# Patient Record
Sex: Male | Born: 1950 | Race: White | Hispanic: No
Health system: Southern US, Community
[De-identification: ages and names within clinical notes are randomized; demographics above are authoritative.]

---

## 2015-12-17 ENCOUNTER — Other Ambulatory Visit (HOSPITAL_COMMUNITY): Payer: Self-pay

## 2015-12-17 ENCOUNTER — Inpatient Hospital Stay
Admission: AD | Admit: 2015-12-17 | Discharge: 2016-02-11 | Disposition: A | Discharge status: Skilled Nursing Facility | Payer: Self-pay | Source: Ambulatory Visit | Attending: Internal Medicine | Admitting: Internal Medicine

## 2015-12-17 DIAGNOSIS — R1310 Dysphagia, unspecified: Secondary | ICD-10-CM

## 2015-12-17 DIAGNOSIS — Z931 Gastrostomy status: Secondary | ICD-10-CM

## 2015-12-17 DIAGNOSIS — I82409 Acute embolism and thrombosis of unspecified deep veins of unspecified lower extremity: Secondary | ICD-10-CM

## 2015-12-17 DIAGNOSIS — Z4659 Encounter for fitting and adjustment of other gastrointestinal appliance and device: Secondary | ICD-10-CM

## 2015-12-17 DIAGNOSIS — R140 Abdominal distension (gaseous): Secondary | ICD-10-CM

## 2015-12-17 DIAGNOSIS — I82403 Acute embolism and thrombosis of unspecified deep veins of lower extremity, bilateral: Secondary | ICD-10-CM

## 2015-12-17 DIAGNOSIS — Z9889 Other specified postprocedural states: Secondary | ICD-10-CM

## 2015-12-17 DIAGNOSIS — Z452 Encounter for adjustment and management of vascular access device: Secondary | ICD-10-CM

## 2015-12-17 DIAGNOSIS — A419 Sepsis, unspecified organism: Secondary | ICD-10-CM

## 2015-12-17 DIAGNOSIS — J189 Pneumonia, unspecified organism: Secondary | ICD-10-CM

## 2015-12-17 DIAGNOSIS — Z9911 Dependence on respirator [ventilator] status: Secondary | ICD-10-CM

## 2015-12-17 DIAGNOSIS — J9690 Respiratory failure, unspecified, unspecified whether with hypoxia or hypercapnia: Secondary | ICD-10-CM

## 2015-12-17 DIAGNOSIS — M869 Osteomyelitis, unspecified: Secondary | ICD-10-CM

## 2015-12-18 LAB — COMPREHENSIVE METABOLIC PANEL
ALBUMIN: 1.7 g/dL — AB (ref 3.5–5.0)
ALK PHOS: 101 U/L (ref 38–126)
ALT: 13 U/L — ABNORMAL LOW (ref 17–63)
ANION GAP: 8 (ref 5–15)
AST: 18 U/L (ref 15–41)
BUN: 15 mg/dL (ref 6–20)
CALCIUM: 7.6 mg/dL — AB (ref 8.9–10.3)
CO2: 19 mmol/L — AB (ref 22–32)
Chloride: 114 mmol/L — ABNORMAL HIGH (ref 101–111)
Creatinine, Ser: 1.67 mg/dL — ABNORMAL HIGH (ref 0.61–1.24)
GFR calc non Af Amer: 41 mL/min — ABNORMAL LOW (ref >60)
GFR, EST AFRICAN AMERICAN: 47 mL/min — AB (ref >60)
Glucose, Bld: 248 mg/dL — ABNORMAL HIGH (ref 65–99)
POTASSIUM: 3.3 mmol/L — AB (ref 3.5–5.1)
Sodium: 141 mmol/L (ref 135–145)
TOTAL PROTEIN: 4.8 g/dL — AB (ref 6.5–8.1)
Total Bilirubin: 0.4 mg/dL (ref 0.3–1.2)

## 2015-12-18 LAB — CBC WITH DIFFERENTIAL/PLATELET
BASOS ABS: 0 10*3/uL (ref 0.0–0.1)
Basophils Relative: 0 %
Eosinophils Absolute: 0.4 10*3/uL (ref 0.0–0.7)
Eosinophils Relative: 3 %
HEMATOCRIT: 32.8 % — AB (ref 39.0–52.0)
Hemoglobin: 10.5 g/dL — ABNORMAL LOW (ref 13.0–17.0)
LYMPHS PCT: 9 %
Lymphs Abs: 1.4 10*3/uL (ref 0.7–4.0)
MCH: 29.5 pg (ref 26.0–34.0)
MCHC: 32 g/dL (ref 30.0–36.0)
MCV: 92.1 fL (ref 78.0–100.0)
Monocytes Absolute: 1.2 10*3/uL — ABNORMAL HIGH (ref 0.1–1.0)
Monocytes Relative: 7 %
NEUTROS ABS: 12.7 10*3/uL — AB (ref 1.7–7.7)
Neutrophils Relative %: 81 %
PLATELETS: 286 10*3/uL (ref 150–400)
RBC: 3.56 MIL/uL — AB (ref 4.22–5.81)
RDW: 13.9 % (ref 11.5–15.5)
WBC: 15.7 10*3/uL — AB (ref 4.0–10.5)

## 2015-12-18 LAB — T4, FREE: Free T4: 1.03 ng/dL (ref 0.61–1.12)

## 2015-12-18 LAB — PROTIME-INR
INR: 1.23 (ref 0.00–1.49)
PROTHROMBIN TIME: 15.6 s — AB (ref 11.6–15.2)

## 2015-12-18 LAB — MAGNESIUM: Magnesium: 1.6 mg/dL — ABNORMAL LOW (ref 1.7–2.4)

## 2015-12-18 LAB — C-REACTIVE PROTEIN: CRP: 1.5 mg/dL — AB (ref <1.0)

## 2015-12-18 LAB — SEDIMENTATION RATE: SED RATE: 77 mm/h — AB (ref 0–16)

## 2015-12-18 LAB — PHOSPHORUS: PHOSPHORUS: 2.3 mg/dL — AB (ref 2.5–4.6)

## 2015-12-18 LAB — PROCALCITONIN: PROCALCITONIN: 0.21 ng/mL

## 2015-12-18 LAB — TSH: TSH: 1.527 u[IU]/mL (ref 0.350–4.500)

## 2015-12-18 NOTE — Consult Note (Signed)
    ORTHOPAEDIC CONSULTATION  REQUESTING PHYSICIAN: Carron CurieAli Hijazi, MD  Chief Complaint: Dehiscence right below the knee amputation  HPI: Lawrence PandaGerald E Brayman Jr. is a 65 y.o. male who presents with bilateral transtibial amputations. He states that the right below the knee dictation was about 6 months ago.  No past medical history on file. No past surgical history on file. Social History   Social History  . Marital Status: Divorced    Spouse Name: N/A  . Number of Children: N/A  . Years of Education: N/A   Social History Main Topics  . Smoking status: Not on file  . Smokeless tobacco: Not on file  . Alcohol Use: Not on file  . Drug Use: Not on file  . Sexual Activity: Not on file   Other Topics Concern  . Not on file   Social History Narrative  . No narrative on file   No family history on file. - negative except otherwise stated in the family history section Allergies not on file Prior to Admission medications   Not on File   Dg Chest Port 1 View  12/17/2015  CLINICAL DATA:  Shortness of breath for 1 day EXAM: PORTABLE CHEST 1 VIEW COMPARISON:  None. FINDINGS: Right jugular central venous catheter with the tip projecting over the SVC. There is no focal parenchymal opacity. There is no pleural effusion or pneumothorax. The heart and mediastinal contours are unremarkable. The osseous structures are unremarkable. IMPRESSION: No active disease. Electronically Signed   By: Elige KoHetal  Patel   On: 12/17/2015 18:50   - pertinent xrays, CT, MRI studies were reviewed and independently interpreted  Positive ROS: All other systems have been reviewed and were otherwise negative with the exception of those mentioned in the HPI and as above.  Physical Exam: General: Alert, no acute distress Cardiovascular: Edema bilateral lower extremities Respiratory: No cyanosis, no use of accessory musculature GI: No organomegaly, abdomen is soft and non-tender Skin: Necrotic transtibial amputation the  right with staples in place. Neurologic: Sensation intact distally Psychiatric: Patient is competent for consent with normal mood and affect Lymphatic: No axillary or cervical lymphadenopathy  MUSCULOSKELETAL:  On examination patient has a well-healed left transtibial amputation on the right there is necrotic tissue open wound with no purulent drainage no ascending cellulitis.  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  Plan: I discussed with the patient recommendation to proceed with an above-the-knee amputation on the right. Patient does not have enough soft tissue to preserve a transtibial amputation. Discussed that without surgery he is at risk of loss of life due to the necrotic infection of the right transtibial amputation. Patient states he is not ready to make a decision at this time and he would like to contact me when he is ready to proceed with surgery. Discussed that that would be okay but I feel that this needs to be done urgently due to the risk of patient's wife due to further infection from the wound dehiscence. Patient states he understands.  Thank you for the consult and the opportunity to see Mr. Carl BestWheeler  Goebel Hellums, MD [facility] [phone] 6:43 PM

## 2015-12-19 ENCOUNTER — Other Ambulatory Visit (HOSPITAL_COMMUNITY): Payer: Self-pay

## 2015-12-19 LAB — URINALYSIS, ROUTINE W REFLEX MICROSCOPIC
BILIRUBIN URINE: NEGATIVE
Glucose, UA: 500 mg/dL — AB
KETONES UR: NEGATIVE mg/dL
Leukocytes, UA: NEGATIVE
NITRITE: NEGATIVE
Specific Gravity, Urine: 1.021 (ref 1.005–1.030)
pH: 6 (ref 5.0–8.0)

## 2015-12-19 LAB — CBC WITH DIFFERENTIAL/PLATELET
BASOS ABS: 0 10*3/uL (ref 0.0–0.1)
BASOS PCT: 0 %
Eosinophils Absolute: 0.5 10*3/uL (ref 0.0–0.7)
Eosinophils Relative: 4 %
HEMATOCRIT: 33.1 % — AB (ref 39.0–52.0)
Hemoglobin: 11 g/dL — ABNORMAL LOW (ref 13.0–17.0)
LYMPHS PCT: 18 %
Lymphs Abs: 2.1 10*3/uL (ref 0.7–4.0)
MCH: 30.2 pg (ref 26.0–34.0)
MCHC: 33.2 g/dL (ref 30.0–36.0)
MCV: 90.9 fL (ref 78.0–100.0)
MONO ABS: 1.4 10*3/uL — AB (ref 0.1–1.0)
Monocytes Relative: 12 %
NEUTROS ABS: 7.8 10*3/uL — AB (ref 1.7–7.7)
Neutrophils Relative %: 66 %
PLATELETS: 336 10*3/uL (ref 150–400)
RBC: 3.64 MIL/uL — AB (ref 4.22–5.81)
RDW: 13.9 % (ref 11.5–15.5)
WBC: 11.9 10*3/uL — AB (ref 4.0–10.5)

## 2015-12-19 LAB — BASIC METABOLIC PANEL
ANION GAP: 7 (ref 5–15)
BUN: 12 mg/dL (ref 6–20)
CALCIUM: 7.8 mg/dL — AB (ref 8.9–10.3)
CO2: 22 mmol/L (ref 22–32)
Chloride: 113 mmol/L — ABNORMAL HIGH (ref 101–111)
Creatinine, Ser: 1.57 mg/dL — ABNORMAL HIGH (ref 0.61–1.24)
GFR, EST AFRICAN AMERICAN: 51 mL/min — AB (ref >60)
GFR, EST NON AFRICAN AMERICAN: 44 mL/min — AB (ref >60)
Glucose, Bld: 123 mg/dL — ABNORMAL HIGH (ref 65–99)
POTASSIUM: 2.9 mmol/L — AB (ref 3.5–5.1)
Sodium: 142 mmol/L (ref 135–145)

## 2015-12-19 LAB — URINE MICROSCOPIC-ADD ON

## 2015-12-19 LAB — HEMOGLOBIN A1C
HEMOGLOBIN A1C: 9.3 % — AB (ref 4.8–5.6)
MEAN PLASMA GLUCOSE: 220 mg/dL

## 2015-12-19 LAB — MAGNESIUM: Magnesium: 1.4 mg/dL — ABNORMAL LOW (ref 1.7–2.4)

## 2015-12-19 LAB — PHOSPHORUS: PHOSPHORUS: 2.8 mg/dL (ref 2.5–4.6)

## 2015-12-20 ENCOUNTER — Institutional Professional Consult (permissible substitution) (HOSPITAL_BASED_OUTPATIENT_CLINIC_OR_DEPARTMENT_OTHER): Payer: Self-pay

## 2015-12-20 ENCOUNTER — Other Ambulatory Visit (HOSPITAL_COMMUNITY): Payer: Self-pay

## 2015-12-20 DIAGNOSIS — I82409 Acute embolism and thrombosis of unspecified deep veins of unspecified lower extremity: Secondary | ICD-10-CM

## 2015-12-20 LAB — BASIC METABOLIC PANEL
ANION GAP: 5 (ref 5–15)
BUN: 12 mg/dL (ref 6–20)
CALCIUM: 7.7 mg/dL — AB (ref 8.9–10.3)
CHLORIDE: 110 mmol/L (ref 101–111)
CO2: 23 mmol/L (ref 22–32)
CREATININE: 1.75 mg/dL — AB (ref 0.61–1.24)
GFR calc Af Amer: 45 mL/min — ABNORMAL LOW (ref >60)
GFR calc non Af Amer: 39 mL/min — ABNORMAL LOW (ref >60)
GLUCOSE: 136 mg/dL — AB (ref 65–99)
Potassium: 2.8 mmol/L — ABNORMAL LOW (ref 3.5–5.1)
Sodium: 138 mmol/L (ref 135–145)

## 2015-12-20 LAB — CBC
HCT: 30.3 % — ABNORMAL LOW (ref 39.0–52.0)
HEMOGLOBIN: 10 g/dL — AB (ref 13.0–17.0)
MCH: 29.9 pg (ref 26.0–34.0)
MCHC: 33 g/dL (ref 30.0–36.0)
MCV: 90.7 fL (ref 78.0–100.0)
Platelets: 390 10*3/uL (ref 150–400)
RBC: 3.34 MIL/uL — AB (ref 4.22–5.81)
RDW: 13.8 % (ref 11.5–15.5)
WBC: 10.7 10*3/uL — ABNORMAL HIGH (ref 4.0–10.5)

## 2015-12-20 LAB — URINE CULTURE: CULTURE: NO GROWTH

## 2015-12-20 NOTE — Progress Notes (Signed)
*  PRELIMINARY RESULTS* Vascular Ultrasound Right upper extremity venous duplex has been completed.  Preliminary findings: No evidence of DVT or superficial thrombosis in visualized veins.   Farrel DemarkJill Eunice, RDMS, RVT  12/20/2015, 2:22 PM

## 2015-12-21 LAB — CBC WITH DIFFERENTIAL/PLATELET
Basophils Absolute: 0 K/uL (ref 0.0–0.1)
Basophils Relative: 0 %
Eosinophils Absolute: 0.6 K/uL (ref 0.0–0.7)
Eosinophils Relative: 4 %
HCT: 32.2 % — ABNORMAL LOW (ref 39.0–52.0)
Hemoglobin: 10.5 g/dL — ABNORMAL LOW (ref 13.0–17.0)
Lymphocytes Relative: 10 %
Lymphs Abs: 1.7 K/uL (ref 0.7–4.0)
MCH: 29.3 pg (ref 26.0–34.0)
MCHC: 32.6 g/dL (ref 30.0–36.0)
MCV: 89.9 fL (ref 78.0–100.0)
Monocytes Absolute: 1.7 K/uL — ABNORMAL HIGH (ref 0.1–1.0)
Monocytes Relative: 11 %
Neutro Abs: 12.1 K/uL — ABNORMAL HIGH (ref 1.7–7.7)
Neutrophils Relative %: 75 %
Platelets: 405 K/uL — ABNORMAL HIGH (ref 150–400)
RBC: 3.58 MIL/uL — ABNORMAL LOW (ref 4.22–5.81)
RDW: 13.8 % (ref 11.5–15.5)
WBC: 16.1 K/uL — ABNORMAL HIGH (ref 4.0–10.5)

## 2015-12-21 LAB — BASIC METABOLIC PANEL
Anion gap: 8 (ref 5–15)
BUN: 12 mg/dL (ref 6–20)
CALCIUM: 7.8 mg/dL — AB (ref 8.9–10.3)
CO2: 24 mmol/L (ref 22–32)
CREATININE: 1.59 mg/dL — AB (ref 0.61–1.24)
Chloride: 109 mmol/L (ref 101–111)
GFR calc non Af Amer: 43 mL/min — ABNORMAL LOW (ref >60)
GFR, EST AFRICAN AMERICAN: 50 mL/min — AB (ref >60)
Glucose, Bld: 116 mg/dL — ABNORMAL HIGH (ref 65–99)
Potassium: 3.1 mmol/L — ABNORMAL LOW (ref 3.5–5.1)
SODIUM: 141 mmol/L (ref 135–145)

## 2015-12-22 LAB — CBC WITH DIFFERENTIAL/PLATELET
BASOS ABS: 0 10*3/uL (ref 0.0–0.1)
BASOS PCT: 0 %
Eosinophils Absolute: 0.9 10*3/uL — ABNORMAL HIGH (ref 0.0–0.7)
Eosinophils Relative: 7 %
HEMATOCRIT: 28.8 % — AB (ref 39.0–52.0)
HEMOGLOBIN: 9.4 g/dL — AB (ref 13.0–17.0)
LYMPHS PCT: 19 %
Lymphs Abs: 2.4 10*3/uL (ref 0.7–4.0)
MCH: 29.8 pg (ref 26.0–34.0)
MCHC: 32.6 g/dL (ref 30.0–36.0)
MCV: 91.4 fL (ref 78.0–100.0)
MONOS PCT: 12 %
Monocytes Absolute: 1.5 10*3/uL — ABNORMAL HIGH (ref 0.1–1.0)
NEUTROS ABS: 8 10*3/uL — AB (ref 1.7–7.7)
NEUTROS PCT: 62 %
Platelets: 400 10*3/uL (ref 150–400)
RBC: 3.15 MIL/uL — ABNORMAL LOW (ref 4.22–5.81)
RDW: 14.2 % (ref 11.5–15.5)
WBC: 12.8 10*3/uL — ABNORMAL HIGH (ref 4.0–10.5)

## 2015-12-22 LAB — BASIC METABOLIC PANEL
ANION GAP: 5 (ref 5–15)
BUN: 15 mg/dL (ref 6–20)
CHLORIDE: 111 mmol/L (ref 101–111)
CO2: 24 mmol/L (ref 22–32)
Calcium: 6.9 mg/dL — ABNORMAL LOW (ref 8.9–10.3)
Creatinine, Ser: 1.51 mg/dL — ABNORMAL HIGH (ref 0.61–1.24)
GFR calc non Af Amer: 46 mL/min — ABNORMAL LOW (ref >60)
GFR, EST AFRICAN AMERICAN: 53 mL/min — AB (ref >60)
Glucose, Bld: 81 mg/dL (ref 65–99)
Potassium: 3.4 mmol/L — ABNORMAL LOW (ref 3.5–5.1)
Sodium: 140 mmol/L (ref 135–145)

## 2015-12-23 ENCOUNTER — Other Ambulatory Visit (HOSPITAL_COMMUNITY): Payer: Self-pay

## 2015-12-23 LAB — CBC WITH DIFFERENTIAL/PLATELET
Basophils Absolute: 0 10*3/uL (ref 0.0–0.1)
Basophils Relative: 0 %
EOS PCT: 5 %
Eosinophils Absolute: 0.7 10*3/uL (ref 0.0–0.7)
HEMATOCRIT: 28.1 % — AB (ref 39.0–52.0)
Hemoglobin: 9.2 g/dL — ABNORMAL LOW (ref 13.0–17.0)
LYMPHS ABS: 2.1 10*3/uL (ref 0.7–4.0)
LYMPHS PCT: 15 %
MCH: 29.9 pg (ref 26.0–34.0)
MCHC: 32.7 g/dL (ref 30.0–36.0)
MCV: 91.2 fL (ref 78.0–100.0)
MONO ABS: 1.5 10*3/uL — AB (ref 0.1–1.0)
Monocytes Relative: 10 %
NEUTROS ABS: 10.3 10*3/uL — AB (ref 1.7–7.7)
Neutrophils Relative %: 70 %
PLATELETS: 392 10*3/uL (ref 150–400)
RBC: 3.08 MIL/uL — ABNORMAL LOW (ref 4.22–5.81)
RDW: 14.3 % (ref 11.5–15.5)
WBC: 14.6 10*3/uL — ABNORMAL HIGH (ref 4.0–10.5)

## 2015-12-23 LAB — BASIC METABOLIC PANEL
Anion gap: 5 (ref 5–15)
BUN: 15 mg/dL (ref 6–20)
CALCIUM: 7.3 mg/dL — AB (ref 8.9–10.3)
CO2: 23 mmol/L (ref 22–32)
Chloride: 109 mmol/L (ref 101–111)
Creatinine, Ser: 1.59 mg/dL — ABNORMAL HIGH (ref 0.61–1.24)
GFR calc Af Amer: 50 mL/min — ABNORMAL LOW (ref >60)
GFR, EST NON AFRICAN AMERICAN: 43 mL/min — AB (ref >60)
GLUCOSE: 208 mg/dL — AB (ref 65–99)
POTASSIUM: 4 mmol/L (ref 3.5–5.1)
Sodium: 137 mmol/L (ref 135–145)

## 2015-12-23 LAB — MAGNESIUM: Magnesium: 1.5 mg/dL — ABNORMAL LOW (ref 1.7–2.4)

## 2015-12-23 LAB — PHOSPHORUS: Phosphorus: 3.7 mg/dL (ref 2.5–4.6)

## 2015-12-24 LAB — BASIC METABOLIC PANEL
ANION GAP: 7 (ref 5–15)
BUN: 17 mg/dL (ref 6–20)
CALCIUM: 7.5 mg/dL — AB (ref 8.9–10.3)
CO2: 23 mmol/L (ref 22–32)
Chloride: 108 mmol/L (ref 101–111)
Creatinine, Ser: 1.49 mg/dL — ABNORMAL HIGH (ref 0.61–1.24)
GFR, EST AFRICAN AMERICAN: 54 mL/min — AB (ref >60)
GFR, EST NON AFRICAN AMERICAN: 47 mL/min — AB (ref >60)
Glucose, Bld: 90 mg/dL (ref 65–99)
Potassium: 3.6 mmol/L (ref 3.5–5.1)
Sodium: 138 mmol/L (ref 135–145)

## 2015-12-24 LAB — PHOSPHORUS: PHOSPHORUS: 3.4 mg/dL (ref 2.5–4.6)

## 2015-12-24 LAB — MAGNESIUM: Magnesium: 1.5 mg/dL — ABNORMAL LOW (ref 1.7–2.4)

## 2015-12-26 LAB — RENAL FUNCTION PANEL
ALBUMIN: 1.6 g/dL — AB (ref 3.5–5.0)
ANION GAP: 7 (ref 5–15)
BUN: 15 mg/dL (ref 6–20)
CHLORIDE: 108 mmol/L (ref 101–111)
CO2: 24 mmol/L (ref 22–32)
Calcium: 8.1 mg/dL — ABNORMAL LOW (ref 8.9–10.3)
Creatinine, Ser: 1.54 mg/dL — ABNORMAL HIGH (ref 0.61–1.24)
GFR, EST AFRICAN AMERICAN: 52 mL/min — AB (ref >60)
GFR, EST NON AFRICAN AMERICAN: 45 mL/min — AB (ref >60)
Glucose, Bld: 126 mg/dL — ABNORMAL HIGH (ref 65–99)
PHOSPHORUS: 3 mg/dL (ref 2.5–4.6)
POTASSIUM: 3.7 mmol/L (ref 3.5–5.1)
Sodium: 139 mmol/L (ref 135–145)

## 2015-12-26 LAB — CBC WITH DIFFERENTIAL/PLATELET
BASOS ABS: 0 10*3/uL (ref 0.0–0.1)
BASOS PCT: 0 %
Eosinophils Absolute: 0.4 10*3/uL (ref 0.0–0.7)
Eosinophils Relative: 2 %
HEMATOCRIT: 29.3 % — AB (ref 39.0–52.0)
HEMOGLOBIN: 9.4 g/dL — AB (ref 13.0–17.0)
LYMPHS PCT: 10 %
Lymphs Abs: 1.8 10*3/uL (ref 0.7–4.0)
MCH: 29.6 pg (ref 26.0–34.0)
MCHC: 32.1 g/dL (ref 30.0–36.0)
MCV: 92.1 fL (ref 78.0–100.0)
MONO ABS: 1 10*3/uL (ref 0.1–1.0)
MONOS PCT: 6 %
NEUTROS ABS: 13.9 10*3/uL — AB (ref 1.7–7.7)
NEUTROS PCT: 82 %
Platelets: 350 10*3/uL (ref 150–400)
RBC: 3.18 MIL/uL — ABNORMAL LOW (ref 4.22–5.81)
RDW: 14.4 % (ref 11.5–15.5)
WBC: 17 10*3/uL — ABNORMAL HIGH (ref 4.0–10.5)

## 2015-12-26 LAB — MAGNESIUM: MAGNESIUM: 1.6 mg/dL — AB (ref 1.7–2.4)

## 2015-12-28 LAB — RENAL FUNCTION PANEL
ALBUMIN: 1.5 g/dL — AB (ref 3.5–5.0)
Anion gap: 9 (ref 5–15)
BUN: 17 mg/dL (ref 6–20)
CHLORIDE: 107 mmol/L (ref 101–111)
CO2: 22 mmol/L (ref 22–32)
CREATININE: 1.49 mg/dL — AB (ref 0.61–1.24)
Calcium: 8 mg/dL — ABNORMAL LOW (ref 8.9–10.3)
GFR, EST AFRICAN AMERICAN: 54 mL/min — AB (ref >60)
GFR, EST NON AFRICAN AMERICAN: 47 mL/min — AB (ref >60)
Glucose, Bld: 77 mg/dL (ref 65–99)
PHOSPHORUS: 3 mg/dL (ref 2.5–4.6)
POTASSIUM: 3.6 mmol/L (ref 3.5–5.1)
Sodium: 138 mmol/L (ref 135–145)

## 2015-12-28 LAB — CBC WITH DIFFERENTIAL/PLATELET
BASOS ABS: 0 10*3/uL (ref 0.0–0.1)
Basophils Relative: 0 %
Eosinophils Absolute: 0.4 10*3/uL (ref 0.0–0.7)
Eosinophils Relative: 3 %
HEMATOCRIT: 27.4 % — AB (ref 39.0–52.0)
Hemoglobin: 8.9 g/dL — ABNORMAL LOW (ref 13.0–17.0)
LYMPHS PCT: 21 %
Lymphs Abs: 2.5 10*3/uL (ref 0.7–4.0)
MCH: 29.7 pg (ref 26.0–34.0)
MCHC: 32.5 g/dL (ref 30.0–36.0)
MCV: 91.3 fL (ref 78.0–100.0)
Monocytes Absolute: 1.2 10*3/uL — ABNORMAL HIGH (ref 0.1–1.0)
Monocytes Relative: 10 %
NEUTROS ABS: 8 10*3/uL — AB (ref 1.7–7.7)
NEUTROS PCT: 66 %
PLATELETS: 301 10*3/uL (ref 150–400)
RBC: 3 MIL/uL — AB (ref 4.22–5.81)
RDW: 14.4 % (ref 11.5–15.5)
WBC: 12.1 10*3/uL — AB (ref 4.0–10.5)

## 2015-12-28 LAB — MAGNESIUM: MAGNESIUM: 1.8 mg/dL (ref 1.7–2.4)

## 2015-12-29 ENCOUNTER — Other Ambulatory Visit (HOSPITAL_COMMUNITY): Payer: Self-pay | Admitting: Family

## 2015-12-29 LAB — CBC
HEMATOCRIT: 25.7 % — AB (ref 39.0–52.0)
HEMOGLOBIN: 8.3 g/dL — AB (ref 13.0–17.0)
MCH: 29.5 pg (ref 26.0–34.0)
MCHC: 32.3 g/dL (ref 30.0–36.0)
MCV: 91.5 fL (ref 78.0–100.0)
Platelets: 267 10*3/uL (ref 150–400)
RBC: 2.81 MIL/uL — ABNORMAL LOW (ref 4.22–5.81)
RDW: 14.4 % (ref 11.5–15.5)
WBC: 8.3 10*3/uL (ref 4.0–10.5)

## 2015-12-29 LAB — RENAL FUNCTION PANEL
ALBUMIN: 1.5 g/dL — AB (ref 3.5–5.0)
ANION GAP: 7 (ref 5–15)
BUN: 16 mg/dL (ref 6–20)
CALCIUM: 7.7 mg/dL — AB (ref 8.9–10.3)
CO2: 25 mmol/L (ref 22–32)
Chloride: 105 mmol/L (ref 101–111)
Creatinine, Ser: 1.5 mg/dL — ABNORMAL HIGH (ref 0.61–1.24)
GFR calc non Af Amer: 46 mL/min — ABNORMAL LOW (ref >60)
GFR, EST AFRICAN AMERICAN: 54 mL/min — AB (ref >60)
Glucose, Bld: 134 mg/dL — ABNORMAL HIGH (ref 65–99)
PHOSPHORUS: 3.4 mg/dL (ref 2.5–4.6)
Potassium: 3.8 mmol/L (ref 3.5–5.1)
SODIUM: 137 mmol/L (ref 135–145)

## 2015-12-29 LAB — MAGNESIUM: Magnesium: 1.9 mg/dL (ref 1.7–2.4)

## 2015-12-31 ENCOUNTER — Encounter
Admission: AD | Disposition: A | Discharge status: Skilled Nursing Facility | Payer: Self-pay | Source: Ambulatory Visit | Attending: Internal Medicine

## 2015-12-31 SURGERY — AMPUTATION, ABOVE KNEE
Anesthesia: General | Laterality: Right

## 2016-01-01 LAB — CBC WITH DIFFERENTIAL/PLATELET
BASOS ABS: 0 10*3/uL (ref 0.0–0.1)
BASOS PCT: 1 %
EOS PCT: 6 %
Eosinophils Absolute: 0.3 10*3/uL (ref 0.0–0.7)
HCT: 24.5 % — ABNORMAL LOW (ref 39.0–52.0)
Hemoglobin: 8 g/dL — ABNORMAL LOW (ref 13.0–17.0)
Lymphocytes Relative: 29 %
Lymphs Abs: 1.7 10*3/uL (ref 0.7–4.0)
MCH: 29.6 pg (ref 26.0–34.0)
MCHC: 32.7 g/dL (ref 30.0–36.0)
MCV: 90.7 fL (ref 78.0–100.0)
MONO ABS: 1.1 10*3/uL — AB (ref 0.1–1.0)
Monocytes Relative: 19 %
Neutro Abs: 2.7 10*3/uL (ref 1.7–7.7)
Neutrophils Relative %: 45 %
PLATELETS: 220 10*3/uL (ref 150–400)
RBC: 2.7 MIL/uL — ABNORMAL LOW (ref 4.22–5.81)
RDW: 14.2 % (ref 11.5–15.5)
WBC: 5.9 10*3/uL (ref 4.0–10.5)

## 2016-01-01 LAB — RENAL FUNCTION PANEL
ALBUMIN: 1.2 g/dL — AB (ref 3.5–5.0)
ANION GAP: 5 (ref 5–15)
BUN: 10 mg/dL (ref 6–20)
CALCIUM: 6.6 mg/dL — AB (ref 8.9–10.3)
CO2: 22 mmol/L (ref 22–32)
Chloride: 112 mmol/L — ABNORMAL HIGH (ref 101–111)
Creatinine, Ser: 1.19 mg/dL (ref 0.61–1.24)
GFR calc Af Amer: >60 mL/min (ref >60)
GLUCOSE: 94 mg/dL (ref 65–99)
PHOSPHORUS: 2.7 mg/dL (ref 2.5–4.6)
Potassium: 3.4 mmol/L — ABNORMAL LOW (ref 3.5–5.1)
SODIUM: 139 mmol/L (ref 135–145)

## 2016-01-01 LAB — MAGNESIUM: Magnesium: 1.4 mg/dL — ABNORMAL LOW (ref 1.7–2.4)

## 2016-01-02 LAB — RENAL FUNCTION PANEL
ALBUMIN: 1.6 g/dL — AB (ref 3.5–5.0)
ANION GAP: 10 (ref 5–15)
BUN: 13 mg/dL (ref 6–20)
CALCIUM: 7.9 mg/dL — AB (ref 8.9–10.3)
CO2: 23 mmol/L (ref 22–32)
CREATININE: 1.55 mg/dL — AB (ref 0.61–1.24)
Chloride: 103 mmol/L (ref 101–111)
GFR calc non Af Amer: 45 mL/min — ABNORMAL LOW (ref >60)
GFR, EST AFRICAN AMERICAN: 52 mL/min — AB (ref >60)
GLUCOSE: 165 mg/dL — AB (ref 65–99)
PHOSPHORUS: 3.2 mg/dL (ref 2.5–4.6)
Potassium: 4.2 mmol/L (ref 3.5–5.1)
SODIUM: 136 mmol/L (ref 135–145)

## 2016-01-02 LAB — MAGNESIUM: Magnesium: 1.8 mg/dL (ref 1.7–2.4)

## 2016-01-03 ENCOUNTER — Encounter (HOSPITAL_BASED_OUTPATIENT_CLINIC_OR_DEPARTMENT_OTHER): Payer: Self-pay

## 2016-01-03 DIAGNOSIS — I82403 Acute embolism and thrombosis of unspecified deep veins of lower extremity, bilateral: Secondary | ICD-10-CM

## 2016-01-03 LAB — SEDIMENTATION RATE: Sed Rate: 90 mm/hr — ABNORMAL HIGH (ref 0–16)

## 2016-01-03 LAB — CBC
HCT: 26 % — ABNORMAL LOW (ref 39.0–52.0)
Hemoglobin: 8.3 g/dL — ABNORMAL LOW (ref 13.0–17.0)
MCH: 29.1 pg (ref 26.0–34.0)
MCHC: 31.9 g/dL (ref 30.0–36.0)
MCV: 91.2 fL (ref 78.0–100.0)
PLATELETS: 187 10*3/uL (ref 150–400)
RBC: 2.85 MIL/uL — ABNORMAL LOW (ref 4.22–5.81)
RDW: 14.1 % (ref 11.5–15.5)
WBC: 6.6 10*3/uL (ref 4.0–10.5)

## 2016-01-03 LAB — RENAL FUNCTION PANEL
ANION GAP: 8 (ref 5–15)
Albumin: 1.7 g/dL — ABNORMAL LOW (ref 3.5–5.0)
BUN: 15 mg/dL (ref 6–20)
CO2: 28 mmol/L (ref 22–32)
Calcium: 8.2 mg/dL — ABNORMAL LOW (ref 8.9–10.3)
Chloride: 102 mmol/L (ref 101–111)
Creatinine, Ser: 1.52 mg/dL — ABNORMAL HIGH (ref 0.61–1.24)
GFR calc non Af Amer: 46 mL/min — ABNORMAL LOW (ref >60)
GFR, EST AFRICAN AMERICAN: 53 mL/min — AB (ref >60)
GLUCOSE: 96 mg/dL (ref 65–99)
POTASSIUM: 3.6 mmol/L (ref 3.5–5.1)
Phosphorus: 2.8 mg/dL (ref 2.5–4.6)
Sodium: 138 mmol/L (ref 135–145)

## 2016-01-03 LAB — C DIFFICILE QUICK SCREEN W PCR REFLEX
C DIFFICILE (CDIFF) INTERP: NEGATIVE
C DIFFICILE (CDIFF) TOXIN: NEGATIVE
C Diff antigen: NEGATIVE

## 2016-01-03 LAB — C-REACTIVE PROTEIN: CRP: 1 mg/dL — ABNORMAL HIGH (ref <1.0)

## 2016-01-03 LAB — MAGNESIUM: MAGNESIUM: 1.8 mg/dL (ref 1.7–2.4)

## 2016-01-03 NOTE — Progress Notes (Signed)
*  Preliminary Results* Right upper extremity venous duplex completed. Study was technically difficult due to poor patient cooperation. Right upper extremity is negative for deep and superficial vein thrombosis. No obvious change when compared to prior study 12/20/15.  01/03/2016 10:44 AM  Gertie FeyMichelle Sally Reimers, RVT, RDCS, RDMS

## 2016-01-04 LAB — CBC
HCT: 23.1 % — ABNORMAL LOW (ref 39.0–52.0)
HCT: 23.3 % — ABNORMAL LOW (ref 39.0–52.0)
HEMATOCRIT: 20.5 % — AB (ref 39.0–52.0)
HEMOGLOBIN: 6.6 g/dL — AB (ref 13.0–17.0)
HEMOGLOBIN: 7.5 g/dL — AB (ref 13.0–17.0)
Hemoglobin: 7.6 g/dL — ABNORMAL LOW (ref 13.0–17.0)
MCH: 29.6 pg (ref 26.0–34.0)
MCH: 29.8 pg (ref 26.0–34.0)
MCH: 29.6 pg (ref 26.0–34.0)
MCHC: 32.2 g/dL (ref 30.0–36.0)
MCHC: 32.9 g/dL (ref 30.0–36.0)
MCHC: 32.2 g/dL (ref 30.0–36.0)
MCV: 91.9 fL (ref 78.0–100.0)
MCV: 90.6 fL (ref 78.0–100.0)
MCV: 92.1 fL (ref 78.0–100.0)
PLATELETS: 177 10*3/uL (ref 150–400)
Platelets: 187 10*3/uL (ref 150–400)
Platelets: 177 K/uL (ref 150–400)
RBC: 2.23 MIL/uL — AB (ref 4.22–5.81)
RBC: 2.55 MIL/uL — ABNORMAL LOW (ref 4.22–5.81)
RBC: 2.53 MIL/uL — AB (ref 4.22–5.81)
RDW: 14 % (ref 11.5–15.5)
RDW: 13.9 % (ref 11.5–15.5)
RDW: 14.1 % (ref 11.5–15.5)
WBC: 7.2 10*3/uL (ref 4.0–10.5)
WBC: 6 K/uL (ref 4.0–10.5)
WBC: 7 10*3/uL (ref 4.0–10.5)

## 2016-01-04 LAB — RENAL FUNCTION PANEL
ANION GAP: 9 (ref 5–15)
Albumin: 1.8 g/dL — ABNORMAL LOW (ref 3.5–5.0)
BUN: 15 mg/dL (ref 6–20)
CHLORIDE: 101 mmol/L (ref 101–111)
CO2: 26 mmol/L (ref 22–32)
Calcium: 8.1 mg/dL — ABNORMAL LOW (ref 8.9–10.3)
Creatinine, Ser: 1.62 mg/dL — ABNORMAL HIGH (ref 0.61–1.24)
GFR, EST AFRICAN AMERICAN: 49 mL/min — AB (ref >60)
GFR, EST NON AFRICAN AMERICAN: 42 mL/min — AB (ref >60)
Glucose, Bld: 139 mg/dL — ABNORMAL HIGH (ref 65–99)
POTASSIUM: 3.9 mmol/L (ref 3.5–5.1)
Phosphorus: 2.8 mg/dL (ref 2.5–4.6)
Sodium: 136 mmol/L (ref 135–145)

## 2016-01-05 ENCOUNTER — Other Ambulatory Visit (HOSPITAL_COMMUNITY): Payer: Self-pay

## 2016-01-05 LAB — C DIFFICILE QUICK SCREEN W PCR REFLEX
C Diff antigen: NEGATIVE
C Diff interpretation: NEGATIVE
C Diff toxin: NEGATIVE

## 2016-01-05 LAB — CBC
HEMATOCRIT: 21.3 % — AB (ref 39.0–52.0)
HEMOGLOBIN: 7.1 g/dL — AB (ref 13.0–17.0)
MCH: 30 pg (ref 26.0–34.0)
MCHC: 33.3 g/dL (ref 30.0–36.0)
MCV: 89.9 fL (ref 78.0–100.0)
Platelets: 150 10*3/uL (ref 150–400)
RBC: 2.37 MIL/uL — AB (ref 4.22–5.81)
RDW: 14.1 % (ref 11.5–15.5)
WBC: 6.3 10*3/uL (ref 4.0–10.5)

## 2016-01-05 LAB — IRON AND TIBC: IRON: 78 ug/dL (ref 45–182)

## 2016-01-05 LAB — FERRITIN: Ferritin: 493 ng/mL — ABNORMAL HIGH (ref 24–336)

## 2016-01-05 LAB — RENAL FUNCTION PANEL
ANION GAP: 10 (ref 5–15)
Albumin: 1.6 g/dL — ABNORMAL LOW (ref 3.5–5.0)
Anion gap: 9 (ref 5–15)
BUN: 15 mg/dL (ref 6–20)
CALCIUM: 7.8 mg/dL — AB (ref 8.9–10.3)
CO2: 25 mmol/L (ref 22–32)
CREATININE: 1.62 mg/dL — AB (ref 0.61–1.24)
Chloride: 103 mmol/L (ref 101–111)
GFR, EST AFRICAN AMERICAN: 49 mL/min — AB (ref >60)
GFR, EST NON AFRICAN AMERICAN: 42 mL/min — AB (ref >60)
Glucose, Bld: 160 mg/dL — ABNORMAL HIGH (ref 65–99)
Phosphorus: 3.5 mg/dL (ref 2.5–4.6)
Potassium: 4.2 mmol/L (ref 3.5–5.1)
SODIUM: 137 mmol/L (ref 135–145)

## 2016-01-05 LAB — VITAMIN B12: Vitamin B-12: 1182 pg/mL — ABNORMAL HIGH (ref 180–914)

## 2016-01-05 LAB — MAGNESIUM
MAGNESIUM: 1.7 mg/dL (ref 1.7–2.4)
Magnesium: 1.7 mg/dL (ref 1.7–2.4)

## 2016-01-05 LAB — FOLATE: FOLATE: 9.8 ng/mL (ref >5.9)

## 2016-01-05 LAB — PHOSPHORUS

## 2016-01-06 LAB — RENAL FUNCTION PANEL
ANION GAP: 11 (ref 5–15)
Albumin: 1.7 g/dL — ABNORMAL LOW (ref 3.5–5.0)
BUN: 15 mg/dL (ref 6–20)
CHLORIDE: 104 mmol/L (ref 101–111)
CO2: 23 mmol/L (ref 22–32)
Calcium: 8 mg/dL — ABNORMAL LOW (ref 8.9–10.3)
Creatinine, Ser: 1.78 mg/dL — ABNORMAL HIGH (ref 0.61–1.24)
GFR calc non Af Amer: 38 mL/min — ABNORMAL LOW (ref >60)
GFR, EST AFRICAN AMERICAN: 44 mL/min — AB (ref >60)
GLUCOSE: 122 mg/dL — AB (ref 65–99)
Phosphorus: 3.5 mg/dL (ref 2.5–4.6)
Potassium: 3.8 mmol/L (ref 3.5–5.1)
Sodium: 138 mmol/L (ref 135–145)

## 2016-01-06 LAB — SEDIMENTATION RATE: SED RATE: 95 mm/h — AB (ref 0–16)

## 2016-01-06 LAB — CBC WITH DIFFERENTIAL/PLATELET
Basophils Absolute: 0 10*3/uL (ref 0.0–0.1)
Basophils Relative: 0 %
EOS PCT: 8 %
Eosinophils Absolute: 0.5 10*3/uL (ref 0.0–0.7)
HCT: 21.6 % — ABNORMAL LOW (ref 39.0–52.0)
HEMOGLOBIN: 7 g/dL — AB (ref 13.0–17.0)
LYMPHS ABS: 1.6 10*3/uL (ref 0.7–4.0)
LYMPHS PCT: 27 %
MCH: 29 pg (ref 26.0–34.0)
MCHC: 32.4 g/dL (ref 30.0–36.0)
MCV: 89.6 fL (ref 78.0–100.0)
MONOS PCT: 12 %
Monocytes Absolute: 0.8 10*3/uL (ref 0.1–1.0)
NEUTROS PCT: 53 %
Neutro Abs: 3.3 10*3/uL (ref 1.7–7.7)
Platelets: 159 10*3/uL (ref 150–400)
RBC: 2.41 MIL/uL — AB (ref 4.22–5.81)
RDW: 14.1 % (ref 11.5–15.5)
WBC: 6.2 10*3/uL (ref 4.0–10.5)

## 2016-01-06 LAB — HEMOGLOBIN A1C
Hgb A1c MFr Bld: 8.2 % — ABNORMAL HIGH (ref 4.8–5.6)
Mean Plasma Glucose: 189 mg/dL

## 2016-01-06 LAB — C-REACTIVE PROTEIN: CRP: 2 mg/dL — ABNORMAL HIGH (ref <1.0)

## 2016-01-07 LAB — RENAL FUNCTION PANEL
ALBUMIN: 1.6 g/dL — AB (ref 3.5–5.0)
Anion gap: 9 (ref 5–15)
BUN: 18 mg/dL (ref 6–20)
CO2: 22 mmol/L (ref 22–32)
CREATININE: 1.88 mg/dL — AB (ref 0.61–1.24)
Calcium: 7.8 mg/dL — ABNORMAL LOW (ref 8.9–10.3)
Chloride: 109 mmol/L (ref 101–111)
GFR, EST AFRICAN AMERICAN: 41 mL/min — AB (ref >60)
GFR, EST NON AFRICAN AMERICAN: 35 mL/min — AB (ref >60)
Glucose, Bld: 108 mg/dL — ABNORMAL HIGH (ref 65–99)
PHOSPHORUS: 3.8 mg/dL (ref 2.5–4.6)
Potassium: 3.6 mmol/L (ref 3.5–5.1)
Sodium: 140 mmol/L (ref 135–145)

## 2016-01-07 LAB — CBC WITH DIFFERENTIAL/PLATELET
BASOS ABS: 0 10*3/uL (ref 0.0–0.1)
BASOS PCT: 0 %
EOS PCT: 7 %
Eosinophils Absolute: 0.5 10*3/uL (ref 0.0–0.7)
HEMATOCRIT: 19.1 % — AB (ref 39.0–52.0)
Hemoglobin: 6.2 g/dL — CL (ref 13.0–17.0)
LYMPHS PCT: 26 %
Lymphs Abs: 1.6 10*3/uL (ref 0.7–4.0)
MCH: 28.8 pg (ref 26.0–34.0)
MCHC: 32.5 g/dL (ref 30.0–36.0)
MCV: 88.8 fL (ref 78.0–100.0)
MONO ABS: 1 10*3/uL (ref 0.1–1.0)
Monocytes Relative: 15 %
NEUTROS ABS: 3.3 10*3/uL (ref 1.7–7.7)
Neutrophils Relative %: 52 %
PLATELETS: 177 10*3/uL (ref 150–400)
RBC: 2.15 MIL/uL — AB (ref 4.22–5.81)
RDW: 14.5 % (ref 11.5–15.5)
WBC: 6.3 10*3/uL (ref 4.0–10.5)

## 2016-01-07 LAB — ABO/RH: ABO/RH(D): AB POS

## 2016-01-07 LAB — PHOSPHORUS: PHOSPHORUS: 3.8 mg/dL (ref 2.5–4.6)

## 2016-01-07 LAB — PREPARE RBC (CROSSMATCH)

## 2016-01-07 LAB — MAGNESIUM: MAGNESIUM: 1.7 mg/dL (ref 1.7–2.4)

## 2016-01-08 ENCOUNTER — Other Ambulatory Visit (HOSPITAL_COMMUNITY): Payer: Self-pay | Admitting: Family

## 2016-01-08 LAB — CBC WITH DIFFERENTIAL/PLATELET
Basophils Absolute: 0 10*3/uL (ref 0.0–0.1)
Basophils Relative: 0 %
EOS ABS: 0.6 10*3/uL (ref 0.0–0.7)
Eosinophils Relative: 8 %
HCT: 25.6 % — ABNORMAL LOW (ref 39.0–52.0)
HEMOGLOBIN: 8.4 g/dL — AB (ref 13.0–17.0)
LYMPHS ABS: 1.9 10*3/uL (ref 0.7–4.0)
Lymphocytes Relative: 24 %
MCH: 29 pg (ref 26.0–34.0)
MCHC: 32.8 g/dL (ref 30.0–36.0)
MCV: 88.3 fL (ref 78.0–100.0)
MONO ABS: 1.3 10*3/uL — AB (ref 0.1–1.0)
MONOS PCT: 17 %
NEUTROS PCT: 51 %
Neutro Abs: 3.9 10*3/uL (ref 1.7–7.7)
Platelets: 164 10*3/uL (ref 150–400)
RBC: 2.9 MIL/uL — ABNORMAL LOW (ref 4.22–5.81)
RDW: 15.5 % (ref 11.5–15.5)
WBC: 7.7 10*3/uL (ref 4.0–10.5)

## 2016-01-08 LAB — BASIC METABOLIC PANEL
Anion gap: 12 (ref 5–15)
BUN: 21 mg/dL — AB (ref 6–20)
CALCIUM: 8 mg/dL — AB (ref 8.9–10.3)
CO2: 23 mmol/L (ref 22–32)
CREATININE: 1.95 mg/dL — AB (ref 0.61–1.24)
Chloride: 105 mmol/L (ref 101–111)
GFR calc Af Amer: 39 mL/min — ABNORMAL LOW (ref >60)
GFR, EST NON AFRICAN AMERICAN: 34 mL/min — AB (ref >60)
GLUCOSE: 186 mg/dL — AB (ref 65–99)
POTASSIUM: 3.9 mmol/L (ref 3.5–5.1)
Sodium: 140 mmol/L (ref 135–145)

## 2016-01-08 LAB — MAGNESIUM: Magnesium: 1.9 mg/dL (ref 1.7–2.4)

## 2016-01-08 LAB — PHOSPHORUS: Phosphorus: 3.9 mg/dL (ref 2.5–4.6)

## 2016-01-08 MED ORDER — CEFAZOLIN SODIUM-DEXTROSE 2-4 GM/100ML-% IV SOLN
2.0000 g | INTRAVENOUS | Status: AC
  Administered 2016-01-09: 2 g via INTRAVENOUS

## 2016-01-08 NOTE — Anesthesia Preprocedure Evaluation (Addendum)
Anesthesia Evaluation  Patient identified by MRN, date of birth, ID band Patient awake and Patient confused    Reviewed: Allergy & Precautions, H&P , NPO status , Patient's Chart, lab work & pertinent test results, reviewed documented beta blocker date and time   Airway Mallampati: II  TM Distance: >3 FB Neck ROM: full    Dental no notable dental hx. (+) Poor Dentition   Pulmonary neg pulmonary ROS,    Pulmonary exam normal        Cardiovascular Exercise Tolerance: Good hypertension, Pt. on medications + Past MI  Normal cardiovascular exam+ dysrhythmias Atrial Fibrillation      Neuro/Psych Depression negative neurological ROS  negative psych ROS   GI/Hepatic negative GI ROS, Neg liver ROS, Status post GI bleed     Endo/Other  negative endocrine ROSdiabetes, Type obesity  Renal/GU Renal InsufficiencyRenal diseasenegative Renal ROS  negative genitourinary   Musculoskeletal   Abdominal Normal abdominal exam  (+)   Peds  Hematology negative hematology ROS (+) Blood dyscrasia, anemia ,   Anesthesia Other Findings   Reproductive/Obstetrics negative OB ROS                            Anesthesia Physical Anesthesia Plan  ASA: III  Anesthesia Plan: General   Post-op Pain Management:    Induction: Intravenous  Airway Management Planned: LMA  Additional Equipment:   Intra-op Plan:   Post-operative Plan: Extubation in OR  Informed Consent: I have reviewed the patients History and Physical, chart, labs and discussed the procedure including the risks, benefits and alternatives for the proposed anesthesia with the patient or authorized representative who has indicated his/her understanding and acceptance.   Dental advisory given and History available from chart only  Plan Discussed with: CRNA, Surgeon and Anesthesiologist  Anesthesia Plan Comments:        Anesthesia Quick  Evaluation

## 2016-01-09 ENCOUNTER — Encounter (HOSPITAL_COMMUNITY): Payer: Self-pay | Admitting: Anesthesiology

## 2016-01-09 ENCOUNTER — Other Ambulatory Visit (HOSPITAL_COMMUNITY): Payer: Self-pay

## 2016-01-09 ENCOUNTER — Encounter
Admission: AD | Disposition: A | Discharge status: Skilled Nursing Facility | Payer: Self-pay | Source: Ambulatory Visit | Attending: Internal Medicine

## 2016-01-09 HISTORY — PX: AMPUTATION: SHX166

## 2016-01-09 LAB — GLUCOSE, CAPILLARY: GLUCOSE-CAPILLARY: 98 mg/dL (ref 65–99)

## 2016-01-09 LAB — PREPARE RBC (CROSSMATCH)

## 2016-01-09 SURGERY — AMPUTATION, ABOVE KNEE
Anesthesia: General | Laterality: Right

## 2016-01-09 MED ORDER — HYDROMORPHONE HCL 1 MG/ML IJ SOLN
0.2500 mg | INTRAMUSCULAR | Status: DC | PRN
  Administered 2016-01-09 (×2): 0.5 mg via INTRAVENOUS

## 2016-01-09 MED ORDER — CEFAZOLIN SODIUM-DEXTROSE 2-4 GM/100ML-% IV SOLN
INTRAVENOUS | Status: AC
  Filled 2016-01-09: qty 100

## 2016-01-09 MED ORDER — 0.9 % SODIUM CHLORIDE (POUR BTL) OPTIME
TOPICAL | Status: DC | PRN
  Administered 2016-01-09: 1000 mL

## 2016-01-09 MED ORDER — PROPOFOL 10 MG/ML IV BOLUS
INTRAVENOUS | Status: DC | PRN
  Administered 2016-01-09: 200 mg via INTRAVENOUS

## 2016-01-09 MED ORDER — PROMETHAZINE HCL 25 MG/ML IJ SOLN: 6.2500 mg | INTRAMUSCULAR | Status: DC | PRN

## 2016-01-09 MED ORDER — LIDOCAINE HCL (CARDIAC) 20 MG/ML IV SOLN
INTRAVENOUS | Status: DC | PRN
  Administered 2016-01-09: 100 mg via INTRAVENOUS

## 2016-01-09 MED ORDER — ONDANSETRON HCL 4 MG/2ML IJ SOLN
INTRAMUSCULAR | Status: DC | PRN
  Administered 2016-01-09: 4 mg via INTRAVENOUS

## 2016-01-09 MED ORDER — HYDROMORPHONE HCL 1 MG/ML IJ SOLN
INTRAMUSCULAR | Status: AC
  Administered 2016-01-09: 0.5 mg via INTRAVENOUS
  Filled 2016-01-09: qty 1

## 2016-01-09 MED ORDER — FENTANYL CITRATE (PF) 100 MCG/2ML IJ SOLN
INTRAMUSCULAR | Status: DC | PRN
  Administered 2016-01-09: 50 ug via INTRAVENOUS

## 2016-01-09 MED ORDER — PHENYLEPHRINE HCL 10 MG/ML IJ SOLN
10.0000 mg | INTRAVENOUS | Status: DC | PRN
  Administered 2016-01-09: 25 ug/min via INTRAVENOUS

## 2016-01-09 MED ORDER — PROPOFOL 10 MG/ML IV BOLUS
INTRAVENOUS | Status: AC
  Filled 2016-01-09: qty 20

## 2016-01-09 MED ORDER — KETOROLAC TROMETHAMINE 30 MG/ML IJ SOLN: 30.0000 mg | Freq: Once | INTRAMUSCULAR | Status: DC

## 2016-01-09 MED ORDER — FENTANYL CITRATE (PF) 250 MCG/5ML IJ SOLN
INTRAMUSCULAR | Status: AC
  Filled 2016-01-09: qty 5

## 2016-01-09 MED ORDER — LACTATED RINGERS IV SOLN
INTRAVENOUS | Status: DC | PRN
  Administered 2016-01-09: 08:00:00 via INTRAVENOUS

## 2016-01-09 MED ORDER — MIDAZOLAM HCL 2 MG/2ML IJ SOLN
INTRAMUSCULAR | Status: AC
  Filled 2016-01-09: qty 2

## 2016-01-09 MED ORDER — MEPERIDINE HCL 25 MG/ML IJ SOLN: 6.2500 mg | INTRAMUSCULAR | Status: DC | PRN

## 2016-01-09 SURGICAL SUPPLY — 47 items
BLADE SAW RECIP 87.9 MT (BLADE) ×3 IMPLANT
BNDG COHESIVE 6X5 TAN STRL LF (GAUZE/BANDAGES/DRESSINGS) ×3 IMPLANT
BNDG GAUZE ELAST 4 BULKY (GAUZE/BANDAGES/DRESSINGS) IMPLANT
CANISTER WOUND CARE 500ML ATS (WOUND CARE) ×3 IMPLANT
COVER SURGICAL LIGHT HANDLE (MISCELLANEOUS) ×3 IMPLANT
CUFF TOURNIQUET SINGLE 34IN LL (TOURNIQUET CUFF) IMPLANT
CUFF TOURNIQUET SINGLE 44IN (TOURNIQUET CUFF) IMPLANT
DRAIN PENROSE 1/2X12 LTX STRL (WOUND CARE) IMPLANT
DRAPE EXTREMITY T 121X128X90 (DRAPE) ×3 IMPLANT
DRAPE INCISE IOBAN 66X45 STRL (DRAPES) ×3 IMPLANT
DRAPE PROXIMA HALF (DRAPES) ×3 IMPLANT
DRAPE U-SHAPE 47X51 STRL (DRAPES) IMPLANT
DRSG ADAPTIC 3X8 NADH LF (GAUZE/BANDAGES/DRESSINGS) IMPLANT
DRSG MEPILEX BORDER 4X8 (GAUZE/BANDAGES/DRESSINGS) ×3 IMPLANT
DRSG PAD ABDOMINAL 8X10 ST (GAUZE/BANDAGES/DRESSINGS) IMPLANT
DRSG VAC ATS LRG SENSATRAC (GAUZE/BANDAGES/DRESSINGS) ×3 IMPLANT
DURAPREP 26ML APPLICATOR (WOUND CARE) ×3 IMPLANT
ELECT CAUTERY BLADE 6.4 (BLADE) IMPLANT
ELECT REM PT RETURN 9FT ADLT (ELECTROSURGICAL) ×3
ELECTRODE REM PT RTRN 9FT ADLT (ELECTROSURGICAL) ×1 IMPLANT
EVACUATOR 1/8 PVC DRAIN (DRAIN) IMPLANT
GAUZE SPONGE 4X4 12PLY STRL (GAUZE/BANDAGES/DRESSINGS) ×3 IMPLANT
GLOVE BIOGEL PI IND STRL 9 (GLOVE) ×1 IMPLANT
GLOVE BIOGEL PI INDICATOR 9 (GLOVE) ×2
GLOVE SURG ORTHO 9.0 STRL STRW (GLOVE) ×3 IMPLANT
GOWN STRL NON-REIN LRG LVL3 (GOWN DISPOSABLE) ×3 IMPLANT
GOWN STRL REUS W/ TWL XL LVL3 (GOWN DISPOSABLE) ×2 IMPLANT
GOWN STRL REUS W/TWL XL LVL3 (GOWN DISPOSABLE) ×4
KIT BASIN OR (CUSTOM PROCEDURE TRAY) ×3 IMPLANT
KIT ROOM TURNOVER OR (KITS) ×3 IMPLANT
MANIFOLD NEPTUNE II (INSTRUMENTS) ×3 IMPLANT
NS IRRIG 1000ML POUR BTL (IV SOLUTION) ×3 IMPLANT
PACK GENERAL/GYN (CUSTOM PROCEDURE TRAY) ×3 IMPLANT
PAD ARMBOARD 7.5X6 YLW CONV (MISCELLANEOUS) ×3 IMPLANT
STAPLER VISISTAT 35W (STAPLE) IMPLANT
STOCKINETTE IMPERVIOUS LG (DRAPES) ×3 IMPLANT
SUT ETHILON 2 0 PSLX (SUTURE) ×3 IMPLANT
SUT PDS AB 1 CT  36 (SUTURE)
SUT PDS AB 1 CT 36 (SUTURE) IMPLANT
SUT SILK 2 0 (SUTURE) ×2
SUT SILK 2-0 18XBRD TIE 12 (SUTURE) ×1 IMPLANT
SUT VIC AB 1 CTX 27 (SUTURE) ×3 IMPLANT
SWAB COLLECTION DEVICE MRSA (MISCELLANEOUS) IMPLANT
TOWEL OR 17X24 6PK STRL BLUE (TOWEL DISPOSABLE) ×6 IMPLANT
TOWEL OR 17X26 10 PK STRL BLUE (TOWEL DISPOSABLE) ×3 IMPLANT
TUBE ANAEROBIC SPECIMEN COL (MISCELLANEOUS) IMPLANT
WATER STERILE IRR 1000ML POUR (IV SOLUTION) ×3 IMPLANT

## 2016-01-09 NOTE — Interval H&P Note (Signed)
History and Physical Interval Note:  01/09/2016 6:38 AM  Lawrence PandaGerald E Domangue Jr.  has presented today for surgery, with the diagnosis of Gangrene Right Below Knee Amputation  The various methods of treatment have been discussed with the patient and family. After consideration of risks, benefits and other options for treatment, the patient has consented to  Procedure(s): Right Above Knee Amputation (Right) as a surgical intervention .  The patient's history has been reviewed, patient examined, no change in status, stable for surgery.  I have reviewed the patient's chart and labs.  Questions were answered to the patient's satisfaction.     Elieser Tetrick V

## 2016-01-09 NOTE — Consult Note (Addendum)
WOC consult requested for stump.  Ortho service is now following for assessment and plan of care and plans for surgery; refer to Dr Lajoyce Cornersuda for further questions. Please re-consult if further assistance is needed.  Thank-you,  Cammie Mcgeeawn Rachelle Edwards MSN, RN, CWOCN, [place]AP, CNS [phone]

## 2016-01-09 NOTE — Progress Notes (Signed)
Patient more aware and awake. Now c/o of pain in rt surgical site. Instead of rt testicle.

## 2016-01-09 NOTE — Addendum Note (Signed)
Addendum  created 01/09/16 0913 by Ronelle Nighharles Dianelys Scinto, MD   Modules edited: Anesthesia Attestations

## 2016-01-09 NOTE — Op Note (Signed)
12/17/2015 - 01/09/2016  8:05 AM  PATIENT:  Lawrence PandaGerald E Valiente Jr.    PRE-OPERATIVE DIAGNOSIS:  Gangrene Right Below Knee Amputation  POST-OPERATIVE DIAGNOSIS:  Same  PROCEDURE:  Right Above Knee Amputation  SURGEON:  Nadara MustardUDA,Modestine Scherzinger V, MD  PHYSICIAN ASSISTANT:None ANESTHESIA:   General  PREOPERATIVE INDICATIONS:  Lawrence PandaGerald E Woodbeck Jr. is a  65 y.o. male with a diagnosis of Gangrene Right Below Knee Amputation who failed conservative measures and elected for surgical management.    The risks benefits and alternatives were discussed with the patient preoperatively including but not limited to the risks of infection, bleeding, nerve injury, cardiopulmonary complications, the need for revision surgery, among others, and the patient was willing to proceed.  OPERATIVE IMPLANTS: Prevena wound VAC  OPERATIVE FINDINGS: Calcified vessels ischemic muscles  OPERATIVE PROCEDURE: Patient brought the operating room and underwent a general anesthetic. After adequate levels anesthesia obtained patient's right lower extremity was first cleansed with Hibiclens dried prepped using DuraPrep draped into a sterile field the open wound was draped out of sterile field with impervious stockinette. A timeout was called. A fishmouth incision was made through the mid thigh the muscles were cut the bone was cut. The vessels were tied with 2-0 silk. The sciatic nerve was pulled cut and allowed retract. The deep superficial fascial layers and skin was closed using 2-0 nylon. A Prevena wound VAC was applied set to -100 mmHg suction this had a good suction fit patient was extubated taken to the PACU in stable condition [facility]

## 2016-01-09 NOTE — Progress Notes (Signed)
Dr. Arby BarretteHatchett and Dr. Lajoyce Cornersuda made aware of pt c/o rt testicular pain   Pt states he had this a few months ago. Doesn't remember treatment.

## 2016-01-09 NOTE — Anesthesia Procedure Notes (Signed)
Procedure Name: LMA Insertion Date/Time: 01/09/2016 7:40 AM Performed by: Tillman AbideHAWKINS, Lucy Woolever B Pre-anesthesia Checklist: Patient identified, Emergency Drugs available, Suction available and Patient being monitored Patient Re-evaluated:Patient Re-evaluated prior to inductionOxygen Delivery Method: Circle System Utilized Preoxygenation: Pre-oxygenation with 100% oxygen Intubation Type: IV induction Ventilation: Mask ventilation without difficulty LMA: LMA inserted LMA Size: 5.0 Number of attempts: 1 Placement Confirmation: positive ETCO2 Tube secured with: Tape Dental Injury: Teeth and Oropharynx as per pre-operative assessment

## 2016-01-09 NOTE — Anesthesia Postprocedure Evaluation (Signed)
Anesthesia Post Note  Patient: Lawrence PandaGerald E Rasor Jr.  Procedure(s) Performed: Procedure(s) (LRB): Right Above Knee Amputation (Right)  Patient location during evaluation: PACU Anesthesia Type: General Level of consciousness: sedated Pain management: pain level controlled Vital Signs Assessment: post-procedure vital signs reviewed and stable Respiratory status: spontaneous breathing Cardiovascular status: stable Postop Assessment: no signs of nausea or vomiting Anesthetic complications: no     Last Vitals:  Filed Vitals:   01/09/16 0817 01/09/16 0827  BP: 139/64 139/64  Pulse: 76 76  Resp: 26 26    Last Pain: There were no vitals filed for this visit. Pain Goal:                 [name],[name] Mikaia Janvier

## 2016-01-09 NOTE — H&P (View-Only) (Signed)
    ORTHOPAEDIC CONSULTATION  REQUESTING PHYSICIAN: Lawrence CurieAli Hijazi, MD  Chief Complaint: Dehiscence right below the knee amputation  HPI: Lawrence PandaGerald E Daigle Jr. is a 65 y.o. male who presents with bilateral transtibial amputations. He states that the right below the knee dictation was about 6 months ago.  No past medical history on file. No past surgical history on file. Social History   Social History  . Marital Status: Divorced    Spouse Name: N/A  . Number of Children: N/A  . Years of Education: N/A   Social History Main Topics  . Smoking status: Not on file  . Smokeless tobacco: Not on file  . Alcohol Use: Not on file  . Drug Use: Not on file  . Sexual Activity: Not on file   Other Topics Concern  . Not on file   Social History Narrative  . No narrative on file   No family history on file. - negative except otherwise stated in the family history section Allergies not on file Prior to Admission medications   Not on File   Dg Chest Port 1 View  12/17/2015  CLINICAL DATA:  Shortness of breath for 1 day EXAM: PORTABLE CHEST 1 VIEW COMPARISON:  None. FINDINGS: Right jugular central venous catheter with the tip projecting over the SVC. There is no focal parenchymal opacity. There is no pleural effusion or pneumothorax. The heart and mediastinal contours are unremarkable. The osseous structures are unremarkable. IMPRESSION: No active disease. Electronically Signed   By: Elige KoHetal  Patel   On: 12/17/2015 18:50   - pertinent xrays, CT, MRI studies were reviewed and independently interpreted  Positive ROS: All other systems have been reviewed and were otherwise negative with the exception of those mentioned in the HPI and as above.  Physical Exam: General: Alert, no acute distress Cardiovascular: Edema bilateral lower extremities Respiratory: No cyanosis, no use of accessory musculature GI: No organomegaly, abdomen is soft and non-tender Skin: Necrotic transtibial amputation the  right with staples in place. Neurologic: Sensation intact distally Psychiatric: Patient is competent for consent with normal mood and affect Lymphatic: No axillary or cervical lymphadenopathy  MUSCULOSKELETAL:  On examination patient has a well-healed left transtibial amputation on the right there is necrotic tissue open wound with no purulent drainage no ascending cellulitis.  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  Plan: I discussed with the patient recommendation to proceed with an above-the-knee amputation on the right. Patient does not have enough soft tissue to preserve a transtibial amputation. Discussed that without surgery he is at risk of loss of life due to the necrotic infection of the right transtibial amputation. Patient states he is not ready to make a decision at this time and he would like to contact me when he is ready to proceed with surgery. Discussed that that would be okay but I feel that this needs to be done urgently due to the risk of patient's wife due to further infection from the wound dehiscence. Patient states he understands.  Thank you for the consult and the opportunity to see [name]  Lawrence Broner, MD [facility] [phone] 6:43 PM

## 2016-01-09 NOTE — Transfer of Care (Signed)
Immediate Anesthesia Transfer of Care Note  Patient: Lawrence PandaGerald E Kempton Jr.  Procedure(s) Performed: Procedure(s): Right Above Knee Amputation (Right)  Patient Location: PACU  Anesthesia Type:General  Level of Consciousness: awake, alert  and oriented  Airway & Oxygen Therapy: Patient Spontanous Breathing and Patient connected to nasal cannula oxygen  Post-op Assessment: Report given to RN and Post -op Vital signs reviewed and stable  Post vital signs: Reviewed and stable  Last Vitals: There were no vitals filed for this visit.  Last Pain: There were no vitals filed for this visit.       Complications: No apparent anesthesia complications

## 2016-01-10 ENCOUNTER — Other Ambulatory Visit (HOSPITAL_COMMUNITY): Payer: Self-pay

## 2016-01-10 LAB — RENAL FUNCTION PANEL
Albumin: 1.3 g/dL — ABNORMAL LOW (ref 3.5–5.0)
Albumin: 1.5 g/dL — ABNORMAL LOW (ref 3.5–5.0)
Anion gap: 10 (ref 5–15)
Anion gap: 14 (ref 5–15)
BUN: 22 mg/dL — ABNORMAL HIGH (ref 6–20)
BUN: 23 mg/dL — AB (ref 6–20)
CALCIUM: 7.6 mg/dL — AB (ref 8.9–10.3)
CALCIUM: 7.6 mg/dL — AB (ref 8.9–10.3)
CHLORIDE: 108 mmol/L (ref 101–111)
CHLORIDE: 108 mmol/L (ref 101–111)
CO2: 19 mmol/L — ABNORMAL LOW (ref 22–32)
CO2: 22 mmol/L (ref 22–32)
CREATININE: 2.29 mg/dL — AB (ref 0.61–1.24)
CREATININE: 2.35 mg/dL — AB (ref 0.61–1.24)
GFR calc Af Amer: 31 mL/min — ABNORMAL LOW (ref >60)
GFR, EST AFRICAN AMERICAN: 32 mL/min — AB (ref >60)
GFR, EST NON AFRICAN AMERICAN: 28 mL/min — AB (ref >60)
GFR, EST NON AFRICAN AMERICAN: 27 mL/min — AB (ref >60)
Glucose, Bld: 206 mg/dL — ABNORMAL HIGH (ref 65–99)
Glucose, Bld: 212 mg/dL — ABNORMAL HIGH (ref 65–99)
PHOSPHORUS: 4.9 mg/dL — AB (ref 2.5–4.6)
Phosphorus: 4 mg/dL (ref 2.5–4.6)
Potassium: 3.6 mmol/L (ref 3.5–5.1)
Potassium: 4.3 mmol/L (ref 3.5–5.1)
SODIUM: 141 mmol/L (ref 135–145)
SODIUM: 140 mmol/L (ref 135–145)

## 2016-01-10 LAB — PROTIME-INR
INR: 1.36 (ref 0.00–1.49)
PROTHROMBIN TIME: 16.9 s — AB (ref 11.6–15.2)

## 2016-01-10 LAB — MAGNESIUM
MAGNESIUM: 1.8 mg/dL (ref 1.7–2.4)
Magnesium: 1.9 mg/dL (ref 1.7–2.4)

## 2016-01-10 LAB — CBC WITH DIFFERENTIAL/PLATELET
Basophils Absolute: 0 10*3/uL (ref 0.0–0.1)
Basophils Relative: 0 %
EOS PCT: 0 %
Eosinophils Absolute: 0 10*3/uL (ref 0.0–0.7)
HCT: 17.6 % — ABNORMAL LOW (ref 39.0–52.0)
Hemoglobin: 5.7 g/dL — CL (ref 13.0–17.0)
Lymphocytes Relative: 14 %
Lymphs Abs: 1.6 10*3/uL (ref 0.7–4.0)
MCH: 29.1 pg (ref 26.0–34.0)
MCHC: 32.4 g/dL (ref 30.0–36.0)
MCV: 89.8 fL (ref 78.0–100.0)
MONO ABS: 1.6 10*3/uL — AB (ref 0.1–1.0)
Monocytes Relative: 14 %
NEUTROS PCT: 72 %
Neutro Abs: 8.1 10*3/uL — ABNORMAL HIGH (ref 1.7–7.7)
PLATELETS: 212 10*3/uL (ref 150–400)
RBC: 1.96 MIL/uL — AB (ref 4.22–5.81)
RDW: 15.1 % (ref 11.5–15.5)
WBC: 11.3 10*3/uL — AB (ref 4.0–10.5)

## 2016-01-10 LAB — CBC
HCT: 25 % — ABNORMAL LOW (ref 39.0–52.0)
Hemoglobin: 8.5 g/dL — ABNORMAL LOW (ref 13.0–17.0)
MCH: 28.9 pg (ref 26.0–34.0)
MCHC: 34 g/dL (ref 30.0–36.0)
MCV: 85 fL (ref 78.0–100.0)
PLATELETS: 210 10*3/uL (ref 150–400)
RBC: 2.94 MIL/uL — AB (ref 4.22–5.81)
RDW: 14.7 % (ref 11.5–15.5)
WBC: 13.7 10*3/uL — AB (ref 4.0–10.5)

## 2016-01-10 LAB — PREPARE RBC (CROSSMATCH)

## 2016-01-11 LAB — RENAL FUNCTION PANEL
ALBUMIN: 1.3 g/dL — AB (ref 3.5–5.0)
ANION GAP: 9 (ref 5–15)
BUN: 27 mg/dL — ABNORMAL HIGH (ref 6–20)
CALCIUM: 7.3 mg/dL — AB (ref 8.9–10.3)
CO2: 24 mmol/L (ref 22–32)
Chloride: 106 mmol/L (ref 101–111)
Creatinine, Ser: 2.58 mg/dL — ABNORMAL HIGH (ref 0.61–1.24)
GFR, EST AFRICAN AMERICAN: 28 mL/min — AB (ref >60)
GFR, EST NON AFRICAN AMERICAN: 24 mL/min — AB (ref >60)
Glucose, Bld: 301 mg/dL — ABNORMAL HIGH (ref 65–99)
PHOSPHORUS: 3.1 mg/dL (ref 2.5–4.6)
Potassium: 3.7 mmol/L (ref 3.5–5.1)
SODIUM: 139 mmol/L (ref 135–145)

## 2016-01-11 LAB — CBC WITH DIFFERENTIAL/PLATELET
BASOS ABS: 0 10*3/uL (ref 0.0–0.1)
Basophils Relative: 0 %
EOS ABS: 0 10*3/uL (ref 0.0–0.7)
Eosinophils Relative: 0 %
HCT: 18.5 % — ABNORMAL LOW (ref 39.0–52.0)
HEMOGLOBIN: 6.3 g/dL — AB (ref 13.0–17.0)
LYMPHS PCT: 19 %
Lymphs Abs: 3.2 10*3/uL (ref 0.7–4.0)
MCH: 28.8 pg (ref 26.0–34.0)
MCHC: 34.1 g/dL (ref 30.0–36.0)
MCV: 84.5 fL (ref 78.0–100.0)
Monocytes Absolute: 3.4 10*3/uL — ABNORMAL HIGH (ref 0.1–1.0)
Monocytes Relative: 20 %
NEUTROS ABS: 10.2 10*3/uL — AB (ref 1.7–7.7)
Neutrophils Relative %: 61 %
Platelets: 223 10*3/uL (ref 150–400)
RBC: 2.19 MIL/uL — ABNORMAL LOW (ref 4.22–5.81)
RDW: 15.4 % (ref 11.5–15.5)
WBC: 16.8 10*3/uL — ABNORMAL HIGH (ref 4.0–10.5)

## 2016-01-11 LAB — TYPE AND SCREEN
ABO/RH(D): AB POS
Antibody Screen: NEGATIVE
UNIT DIVISION: 0
UNIT DIVISION: 0
Unit division: 0
Unit division: 0
Unit division: 0

## 2016-01-11 LAB — HEMOGLOBIN AND HEMATOCRIT, BLOOD
HEMATOCRIT: 19.8 % — AB (ref 39.0–52.0)
HEMOGLOBIN: 6.7 g/dL — AB (ref 13.0–17.0)

## 2016-01-11 LAB — PREPARE RBC (CROSSMATCH)

## 2016-01-11 LAB — MAGNESIUM: Magnesium: 2 mg/dL (ref 1.7–2.4)

## 2016-01-12 ENCOUNTER — Encounter (HOSPITAL_COMMUNITY): Payer: Self-pay | Admitting: Orthopedic Surgery

## 2016-01-12 ENCOUNTER — Other Ambulatory Visit (HOSPITAL_COMMUNITY): Payer: Self-pay

## 2016-01-12 LAB — CBC WITH DIFFERENTIAL/PLATELET
BASOS PCT: 0 %
Basophils Absolute: 0 10*3/uL (ref 0.0–0.1)
EOS ABS: 0.6 10*3/uL (ref 0.0–0.7)
EOS PCT: 3 %
HEMATOCRIT: 25.4 % — AB (ref 39.0–52.0)
Hemoglobin: 8.6 g/dL — ABNORMAL LOW (ref 13.0–17.0)
LYMPHS PCT: 17 %
Lymphs Abs: 3.2 10*3/uL (ref 0.7–4.0)
MCH: 29 pg (ref 26.0–34.0)
MCHC: 33.9 g/dL (ref 30.0–36.0)
MCV: 85.5 fL (ref 78.0–100.0)
MONO ABS: 3 10*3/uL — AB (ref 0.1–1.0)
Monocytes Relative: 16 %
NEUTROS PCT: 64 %
Neutro Abs: 12 10*3/uL — ABNORMAL HIGH (ref 1.7–7.7)
PLATELETS: 208 10*3/uL (ref 150–400)
RBC: 2.97 MIL/uL — AB (ref 4.22–5.81)
RDW: 15 % (ref 11.5–15.5)
WBC: 18.8 10*3/uL — AB (ref 4.0–10.5)

## 2016-01-12 LAB — RENAL FUNCTION PANEL
ALBUMIN: 1.3 g/dL — AB (ref 3.5–5.0)
Anion gap: 7 (ref 5–15)
BUN: 28 mg/dL — AB (ref 6–20)
CHLORIDE: 107 mmol/L (ref 101–111)
CO2: 26 mmol/L (ref 22–32)
CREATININE: 2.3 mg/dL — AB (ref 0.61–1.24)
Calcium: 7.1 mg/dL — ABNORMAL LOW (ref 8.9–10.3)
GFR, EST AFRICAN AMERICAN: 32 mL/min — AB (ref >60)
GFR, EST NON AFRICAN AMERICAN: 28 mL/min — AB (ref >60)
Glucose, Bld: 67 mg/dL (ref 65–99)
PHOSPHORUS: 2 mg/dL — AB (ref 2.5–4.6)
Potassium: 3.1 mmol/L — ABNORMAL LOW (ref 3.5–5.1)
Sodium: 140 mmol/L (ref 135–145)

## 2016-01-12 LAB — TYPE AND SCREEN
ABO/RH(D): AB POS
ANTIBODY SCREEN: NEGATIVE
UNIT DIVISION: 0
Unit division: 0

## 2016-01-12 LAB — MAGNESIUM: MAGNESIUM: 1.8 mg/dL (ref 1.7–2.4)

## 2016-01-13 ENCOUNTER — Encounter (HOSPITAL_BASED_OUTPATIENT_CLINIC_OR_DEPARTMENT_OTHER): Payer: Self-pay

## 2016-01-13 DIAGNOSIS — I82403 Acute embolism and thrombosis of unspecified deep veins of lower extremity, bilateral: Secondary | ICD-10-CM

## 2016-01-13 LAB — RENAL FUNCTION PANEL
Albumin: 1.2 g/dL — ABNORMAL LOW (ref 3.5–5.0)
Anion gap: 8 (ref 5–15)
BUN: 28 mg/dL — AB (ref 6–20)
CHLORIDE: 110 mmol/L (ref 101–111)
CO2: 25 mmol/L (ref 22–32)
Calcium: 6.9 mg/dL — ABNORMAL LOW (ref 8.9–10.3)
Creatinine, Ser: 2.04 mg/dL — ABNORMAL HIGH (ref 0.61–1.24)
GFR calc Af Amer: 37 mL/min — ABNORMAL LOW (ref >60)
GFR, EST NON AFRICAN AMERICAN: 32 mL/min — AB (ref >60)
Glucose, Bld: 67 mg/dL (ref 65–99)
POTASSIUM: 3.6 mmol/L (ref 3.5–5.1)
Phosphorus: 1.9 mg/dL — ABNORMAL LOW (ref 2.5–4.6)
Sodium: 143 mmol/L (ref 135–145)

## 2016-01-13 LAB — C DIFFICILE QUICK SCREEN W PCR REFLEX
C Diff antigen: NEGATIVE
C Diff interpretation: NEGATIVE
C Diff toxin: NEGATIVE

## 2016-01-13 LAB — CBC
HEMATOCRIT: 25.8 % — AB (ref 39.0–52.0)
Hemoglobin: 8.6 g/dL — ABNORMAL LOW (ref 13.0–17.0)
MCH: 29.7 pg (ref 26.0–34.0)
MCHC: 33.3 g/dL (ref 30.0–36.0)
MCV: 89 fL (ref 78.0–100.0)
Platelets: 250 10*3/uL (ref 150–400)
RBC: 2.9 MIL/uL — ABNORMAL LOW (ref 4.22–5.81)
RDW: 15.9 % — AB (ref 11.5–15.5)
WBC: 15.6 10*3/uL — AB (ref 4.0–10.5)

## 2016-01-13 LAB — OCCULT BLOOD X 1 CARD TO LAB, STOOL: FECAL OCCULT BLD: POSITIVE — AB

## 2016-01-13 NOTE — Progress Notes (Signed)
*  PRELIMINARY RESULTS* Vascular Ultrasound Bilateral upper extremity venous duplex has been completed.  Preliminary findings: No evidence of DVT or superficial thrombosis in visualized veins.   Appears unchanged from previous studies 12/20/15 and 01/03/16.     Farrel DemarkJill Eunice, RDMS, RVT  01/13/2016, 9:27 AM

## 2016-01-14 LAB — CBC
HCT: 27.6 % — ABNORMAL LOW (ref 39.0–52.0)
HEMOGLOBIN: 8.8 g/dL — AB (ref 13.0–17.0)
MCH: 29.2 pg (ref 26.0–34.0)
MCHC: 31.9 g/dL (ref 30.0–36.0)
MCV: 91.7 fL (ref 78.0–100.0)
Platelets: 301 10*3/uL (ref 150–400)
RBC: 3.01 MIL/uL — ABNORMAL LOW (ref 4.22–5.81)
RDW: 17.4 % — AB (ref 11.5–15.5)
WBC: 14.1 10*3/uL — ABNORMAL HIGH (ref 4.0–10.5)

## 2016-01-14 LAB — RENAL FUNCTION PANEL
ANION GAP: 9 (ref 5–15)
Albumin: 1.3 g/dL — ABNORMAL LOW (ref 3.5–5.0)
BUN: 35 mg/dL — AB (ref 6–20)
CALCIUM: 6.9 mg/dL — AB (ref 8.9–10.3)
CO2: 20 mmol/L — AB (ref 22–32)
CREATININE: 1.94 mg/dL — AB (ref 0.61–1.24)
Chloride: 113 mmol/L — ABNORMAL HIGH (ref 101–111)
GFR calc Af Amer: 39 mL/min — ABNORMAL LOW (ref >60)
GFR calc non Af Amer: 34 mL/min — ABNORMAL LOW (ref >60)
GLUCOSE: 138 mg/dL — AB (ref 65–99)
Phosphorus: 1.9 mg/dL — ABNORMAL LOW (ref 2.5–4.6)
Potassium: 4 mmol/L (ref 3.5–5.1)
SODIUM: 142 mmol/L (ref 135–145)

## 2016-01-15 LAB — COMPREHENSIVE METABOLIC PANEL
ALBUMIN: 1.1 g/dL — AB (ref 3.5–5.0)
ALK PHOS: 54 U/L (ref 38–126)
ALT: 7 U/L — ABNORMAL LOW (ref 17–63)
ANION GAP: 7 (ref 5–15)
AST: 17 U/L (ref 15–41)
BUN: 38 mg/dL — ABNORMAL HIGH (ref 6–20)
CHLORIDE: 110 mmol/L (ref 101–111)
CO2: 24 mmol/L (ref 22–32)
Calcium: 7.2 mg/dL — ABNORMAL LOW (ref 8.9–10.3)
Creatinine, Ser: 1.91 mg/dL — ABNORMAL HIGH (ref 0.61–1.24)
GFR calc Af Amer: 40 mL/min — ABNORMAL LOW (ref >60)
GFR calc non Af Amer: 35 mL/min — ABNORMAL LOW (ref >60)
GLUCOSE: 112 mg/dL — AB (ref 65–99)
POTASSIUM: 3.9 mmol/L (ref 3.5–5.1)
SODIUM: 141 mmol/L (ref 135–145)
Total Bilirubin: 0.5 mg/dL (ref 0.3–1.2)
Total Protein: 4 g/dL — ABNORMAL LOW (ref 6.5–8.1)

## 2016-01-15 LAB — CBC
HEMATOCRIT: 26.4 % — AB (ref 39.0–52.0)
Hemoglobin: 8.4 g/dL — ABNORMAL LOW (ref 13.0–17.0)
MCH: 29.3 pg (ref 26.0–34.0)
MCHC: 31.8 g/dL (ref 30.0–36.0)
MCV: 92 fL (ref 78.0–100.0)
PLATELETS: 330 10*3/uL (ref 150–400)
RBC: 2.87 MIL/uL — AB (ref 4.22–5.81)
RDW: 17.8 % — ABNORMAL HIGH (ref 11.5–15.5)
WBC: 14.1 10*3/uL — AB (ref 4.0–10.5)

## 2016-01-15 LAB — RENAL FUNCTION PANEL
ANION GAP: 7 (ref 5–15)
Albumin: 1.2 g/dL — ABNORMAL LOW (ref 3.5–5.0)
BUN: 38 mg/dL — ABNORMAL HIGH (ref 6–20)
CHLORIDE: 110 mmol/L (ref 101–111)
CO2: 24 mmol/L (ref 22–32)
Calcium: 7.2 mg/dL — ABNORMAL LOW (ref 8.9–10.3)
Creatinine, Ser: 1.84 mg/dL — ABNORMAL HIGH (ref 0.61–1.24)
GFR, EST AFRICAN AMERICAN: 42 mL/min — AB (ref >60)
GFR, EST NON AFRICAN AMERICAN: 36 mL/min — AB (ref >60)
Glucose, Bld: 111 mg/dL — ABNORMAL HIGH (ref 65–99)
PHOSPHORUS: 2.1 mg/dL — AB (ref 2.5–4.6)
POTASSIUM: 3.8 mmol/L (ref 3.5–5.1)
Sodium: 141 mmol/L (ref 135–145)

## 2016-01-16 LAB — COMPREHENSIVE METABOLIC PANEL
ALK PHOS: 55 U/L (ref 38–126)
ALT: 8 U/L — ABNORMAL LOW (ref 17–63)
ANION GAP: 7 (ref 5–15)
AST: 16 U/L (ref 15–41)
Albumin: 1.2 g/dL — ABNORMAL LOW (ref 3.5–5.0)
BILIRUBIN TOTAL: 0.4 mg/dL (ref 0.3–1.2)
BUN: 43 mg/dL — ABNORMAL HIGH (ref 6–20)
CALCIUM: 7.4 mg/dL — AB (ref 8.9–10.3)
CO2: 24 mmol/L (ref 22–32)
Chloride: 111 mmol/L (ref 101–111)
Creatinine, Ser: 1.87 mg/dL — ABNORMAL HIGH (ref 0.61–1.24)
GFR calc Af Amer: 41 mL/min — ABNORMAL LOW (ref >60)
GFR, EST NON AFRICAN AMERICAN: 36 mL/min — AB (ref >60)
Glucose, Bld: 68 mg/dL (ref 65–99)
POTASSIUM: 4.1 mmol/L (ref 3.5–5.1)
Sodium: 142 mmol/L (ref 135–145)
TOTAL PROTEIN: 4.3 g/dL — AB (ref 6.5–8.1)

## 2016-01-16 LAB — CBC
HEMATOCRIT: 28 % — AB (ref 39.0–52.0)
Hemoglobin: 8.9 g/dL — ABNORMAL LOW (ref 13.0–17.0)
MCH: 29.4 pg (ref 26.0–34.0)
MCHC: 31.8 g/dL (ref 30.0–36.0)
MCV: 92.4 fL (ref 78.0–100.0)
Platelets: 384 10*3/uL (ref 150–400)
RBC: 3.03 MIL/uL — ABNORMAL LOW (ref 4.22–5.81)
RDW: 18 % — AB (ref 11.5–15.5)
WBC: 16.7 10*3/uL — ABNORMAL HIGH (ref 4.0–10.5)

## 2016-01-16 LAB — MAGNESIUM: MAGNESIUM: 1.9 mg/dL (ref 1.7–2.4)

## 2016-01-16 LAB — PHOSPHORUS: Phosphorus: 1.9 mg/dL — ABNORMAL LOW (ref 2.5–4.6)

## 2016-01-17 LAB — COMPREHENSIVE METABOLIC PANEL
ALT: 9 U/L — ABNORMAL LOW (ref 17–63)
AST: 17 U/L (ref 15–41)
Albumin: 1.2 g/dL — ABNORMAL LOW (ref 3.5–5.0)
Alkaline Phosphatase: 55 U/L (ref 38–126)
Anion gap: 8 (ref 5–15)
BILIRUBIN TOTAL: 0.5 mg/dL (ref 0.3–1.2)
BUN: 44 mg/dL — AB (ref 6–20)
CALCIUM: 7.6 mg/dL — AB (ref 8.9–10.3)
CO2: 23 mmol/L (ref 22–32)
CREATININE: 1.8 mg/dL — AB (ref 0.61–1.24)
Chloride: 111 mmol/L (ref 101–111)
GFR calc Af Amer: 43 mL/min — ABNORMAL LOW (ref >60)
GFR, EST NON AFRICAN AMERICAN: 37 mL/min — AB (ref >60)
Glucose, Bld: 179 mg/dL — ABNORMAL HIGH (ref 65–99)
POTASSIUM: 4.5 mmol/L (ref 3.5–5.1)
Sodium: 142 mmol/L (ref 135–145)
TOTAL PROTEIN: 4.3 g/dL — AB (ref 6.5–8.1)

## 2016-01-17 LAB — CBC
HEMATOCRIT: 27.4 % — AB (ref 39.0–52.0)
Hemoglobin: 8.6 g/dL — ABNORMAL LOW (ref 13.0–17.0)
MCH: 29.4 pg (ref 26.0–34.0)
MCHC: 31.4 g/dL (ref 30.0–36.0)
MCV: 93.5 fL (ref 78.0–100.0)
Platelets: 392 10*3/uL (ref 150–400)
RBC: 2.93 MIL/uL — ABNORMAL LOW (ref 4.22–5.81)
RDW: 18.4 % — AB (ref 11.5–15.5)
WBC: 15.4 10*3/uL — AB (ref 4.0–10.5)

## 2016-01-17 LAB — OCCULT BLOOD X 1 CARD TO LAB, STOOL: FECAL OCCULT BLD: POSITIVE — AB

## 2016-01-18 ENCOUNTER — Other Ambulatory Visit (HOSPITAL_COMMUNITY): Payer: Self-pay

## 2016-01-18 LAB — RENAL FUNCTION PANEL
Albumin: 1.1 g/dL — ABNORMAL LOW (ref 3.5–5.0)
Anion gap: 13 (ref 5–15)
BUN: 43 mg/dL — ABNORMAL HIGH (ref 6–20)
CHLORIDE: 112 mmol/L — AB (ref 101–111)
CO2: 21 mmol/L — AB (ref 22–32)
Calcium: 6.9 mg/dL — ABNORMAL LOW (ref 8.9–10.3)
Creatinine, Ser: 1.72 mg/dL — ABNORMAL HIGH (ref 0.61–1.24)
GFR calc non Af Amer: 39 mL/min — ABNORMAL LOW (ref >60)
GFR, EST AFRICAN AMERICAN: 46 mL/min — AB (ref >60)
Glucose, Bld: 258 mg/dL — ABNORMAL HIGH (ref 65–99)
POTASSIUM: 4.6 mmol/L (ref 3.5–5.1)
Phosphorus: 2.4 mg/dL — ABNORMAL LOW (ref 2.5–4.6)
Sodium: 146 mmol/L — ABNORMAL HIGH (ref 135–145)

## 2016-01-18 LAB — BLOOD GAS, ARTERIAL
ACID-BASE DEFICIT: 0.4 mmol/L (ref 0.0–2.0)
Bicarbonate: 23.8 mEq/L (ref 20.0–24.0)
FIO2: 0.21
O2 Saturation: 97.1 %
PCO2 ART: 38.8 mmHg (ref 35.0–45.0)
PH ART: 7.404 (ref 7.350–7.450)
Patient temperature: 98.6
TCO2: 25 mmol/L (ref 0–100)
pO2, Arterial: 84.7 mmHg (ref 80.0–100.0)

## 2016-01-18 LAB — GASTROINTESTINAL PANEL BY PCR, STOOL (REPLACES STOOL CULTURE)
ADENOVIRUS F40/41: NOT DETECTED
Astrovirus: NOT DETECTED
CAMPYLOBACTER SPECIES: NOT DETECTED
CRYPTOSPORIDIUM: NOT DETECTED
Cyclospora cayetanensis: NOT DETECTED
E. COLI O157: NOT DETECTED
ENTEROPATHOGENIC E COLI (EPEC): NOT DETECTED
ENTEROTOXIGENIC E COLI (ETEC): NOT DETECTED
Entamoeba histolytica: NOT DETECTED
Enteroaggregative E coli (EAEC): NOT DETECTED
Giardia lamblia: NOT DETECTED
Norovirus GI/GII: NOT DETECTED
PLESIMONAS SHIGELLOIDES: NOT DETECTED
ROTAVIRUS A: NOT DETECTED
SAPOVIRUS (I, II, IV, AND V): NOT DETECTED
SHIGA LIKE TOXIN PRODUCING E COLI (STEC): NOT DETECTED
Salmonella species: NOT DETECTED
Shigella/Enteroinvasive E coli (EIEC): NOT DETECTED
Vibrio cholerae: NOT DETECTED
Vibrio species: NOT DETECTED
YERSINIA ENTEROCOLITICA: NOT DETECTED

## 2016-01-18 LAB — CBC
HEMATOCRIT: 27.5 % — AB (ref 39.0–52.0)
HEMOGLOBIN: 8.6 g/dL — AB (ref 13.0–17.0)
MCH: 29.5 pg (ref 26.0–34.0)
MCHC: 31.3 g/dL (ref 30.0–36.0)
MCV: 94.2 fL (ref 78.0–100.0)
Platelets: 413 10*3/uL — ABNORMAL HIGH (ref 150–400)
RBC: 2.92 MIL/uL — AB (ref 4.22–5.81)
RDW: 18.5 % — ABNORMAL HIGH (ref 11.5–15.5)
WBC: 15.3 10*3/uL — AB (ref 4.0–10.5)

## 2016-01-19 ENCOUNTER — Other Ambulatory Visit: Payer: Self-pay

## 2016-01-19 ENCOUNTER — Other Ambulatory Visit (HOSPITAL_COMMUNITY): Payer: Self-pay

## 2016-01-19 LAB — PROTIME-INR
INR: 1.2 (ref 0.00–1.49)
Prothrombin Time: 15.3 seconds — ABNORMAL HIGH (ref 11.6–15.2)

## 2016-01-19 LAB — COMPREHENSIVE METABOLIC PANEL
ALBUMIN: 1.3 g/dL — AB (ref 3.5–5.0)
ALT: 10 U/L — AB (ref 17–63)
AST: 17 U/L (ref 15–41)
Alkaline Phosphatase: 51 U/L (ref 38–126)
Anion gap: 6 (ref 5–15)
BUN: 42 mg/dL — AB (ref 6–20)
CHLORIDE: 113 mmol/L — AB (ref 101–111)
CO2: 26 mmol/L (ref 22–32)
Calcium: 7.9 mg/dL — ABNORMAL LOW (ref 8.9–10.3)
Creatinine, Ser: 1.68 mg/dL — ABNORMAL HIGH (ref 0.61–1.24)
GFR calc Af Amer: 47 mL/min — ABNORMAL LOW (ref >60)
GFR calc non Af Amer: 40 mL/min — ABNORMAL LOW (ref >60)
GLUCOSE: 104 mg/dL — AB (ref 65–99)
POTASSIUM: 4.2 mmol/L (ref 3.5–5.1)
Sodium: 145 mmol/L (ref 135–145)
Total Bilirubin: 0.4 mg/dL (ref 0.3–1.2)
Total Protein: 4.3 g/dL — ABNORMAL LOW (ref 6.5–8.1)

## 2016-01-19 LAB — RENAL FUNCTION PANEL
ALBUMIN: 1.2 g/dL — AB (ref 3.5–5.0)
Anion gap: 5 (ref 5–15)
BUN: 48 mg/dL — AB (ref 6–20)
CALCIUM: 7.6 mg/dL — AB (ref 8.9–10.3)
CO2: 27 mmol/L (ref 22–32)
Chloride: 111 mmol/L (ref 101–111)
Creatinine, Ser: 1.76 mg/dL — ABNORMAL HIGH (ref 0.61–1.24)
GFR calc Af Amer: 44 mL/min — ABNORMAL LOW (ref >60)
GFR calc non Af Amer: 38 mL/min — ABNORMAL LOW (ref >60)
GLUCOSE: 217 mg/dL — AB (ref 65–99)
PHOSPHORUS: 3.6 mg/dL (ref 2.5–4.6)
Potassium: 5.4 mmol/L — ABNORMAL HIGH (ref 3.5–5.1)
SODIUM: 143 mmol/L (ref 135–145)

## 2016-01-19 LAB — CBC
HEMATOCRIT: 27.3 % — AB (ref 39.0–52.0)
HEMATOCRIT: 26.1 % — AB (ref 39.0–52.0)
Hemoglobin: 8.6 g/dL — ABNORMAL LOW (ref 13.0–17.0)
Hemoglobin: 7.9 g/dL — ABNORMAL LOW (ref 13.0–17.0)
MCH: 30.2 pg (ref 26.0–34.0)
MCH: 29.7 pg (ref 26.0–34.0)
MCHC: 31.5 g/dL (ref 30.0–36.0)
MCHC: 30.3 g/dL (ref 30.0–36.0)
MCV: 95.8 fL (ref 78.0–100.0)
MCV: 98.1 fL (ref 78.0–100.0)
PLATELETS: 390 10*3/uL (ref 150–400)
PLATELETS: 403 10*3/uL — AB (ref 150–400)
RBC: 2.85 MIL/uL — AB (ref 4.22–5.81)
RBC: 2.66 MIL/uL — ABNORMAL LOW (ref 4.22–5.81)
RDW: 18.7 % — AB (ref 11.5–15.5)
RDW: 19 % — AB (ref 11.5–15.5)
WBC: 14.8 10*3/uL — AB (ref 4.0–10.5)
WBC: 14.7 10*3/uL — ABNORMAL HIGH (ref 4.0–10.5)

## 2016-01-19 LAB — CBC WITH DIFFERENTIAL/PLATELET
BASOS ABS: 0 10*3/uL (ref 0.0–0.1)
Basophils Relative: 0 %
EOS PCT: 0 %
Eosinophils Absolute: 0.1 10*3/uL (ref 0.0–0.7)
HEMATOCRIT: 31.2 % — AB (ref 39.0–52.0)
Hemoglobin: 9.6 g/dL — ABNORMAL LOW (ref 13.0–17.0)
LYMPHS ABS: 1.1 10*3/uL (ref 0.7–4.0)
LYMPHS PCT: 6 %
MCH: 30.4 pg (ref 26.0–34.0)
MCHC: 30.8 g/dL (ref 30.0–36.0)
MCV: 98.7 fL (ref 78.0–100.0)
MONO ABS: 1.6 10*3/uL — AB (ref 0.1–1.0)
MONOS PCT: 8 %
NEUTROS ABS: 16.8 10*3/uL — AB (ref 1.7–7.7)
Neutrophils Relative %: 86 %
PLATELETS: 433 10*3/uL — AB (ref 150–400)
RBC: 3.16 MIL/uL — ABNORMAL LOW (ref 4.22–5.81)
RDW: 18.7 % — AB (ref 11.5–15.5)
WBC: 19.6 10*3/uL — ABNORMAL HIGH (ref 4.0–10.5)

## 2016-01-19 NOTE — Significant Event (Signed)
Rapid Response Event Note  Overview: Time Called: 2056 Arrival Time: 2058 Event Type: Cardiac  Initial Focused Assessment: Code blue    Interventions: ACLS and CPR Plan of Care (if not transferred):  Event Summary: Called to the unit for patient undergoing active cpr. ACLS and CPR was performed for about 33 minutes before ROSC. Patient was placed back on ventilator and now awaiting disposition. Code activity was recorded by unit staff.         Beryl MeagerHarbison, [place]

## 2016-01-19 NOTE — Code Documentation (Signed)
  Patient Name: Lawrence PandaGerald E Handler Jr.   MRN: [id number]   Date of Birth/ Sex: 08/15/1950 , male      Admission Date: 12/17/2015  Attending Provider: Carron CurieAli Hijazi, MD  Primary Diagnosis: <principal problem not specified>   Indication: Pt was in his usual state of health until this PM, when he was noted to be in PEA. Code blue was subsequently called. At the time of arrival on scene, ACLS protocol was underway.   Technical Description:  - CPR performance duration:  34  minutes  - Was defibrillation or cardioversion used? No   - Was external pacer placed? No  - Was patient intubated pre/post CPR? Yes   Medications Administered: Y = Yes; Blank = No Amiodarone    Atropine  Y  Calcium  Y  Epinephrine  Y  Lidocaine    Magnesium    Norepinephrine  Y  Phenylephrine    Sodium bicarbonate  Y  Vasopressin     Post CPR evaluation:  - Final Status - Was patient successfully resuscitated ? Yes - What is current rhythm? Sinus Bradycardia  - What is current hemodynamic status? Stable   Miscellaneous Information:  - Labs sent, including:   - Primary team notified?  Yes  - Family Notified? Yes  - Additional notes/ transfer status: Primary team Dr. Omer JackElmira Stephenson has taken over the care of the patient.      Lawrence GiovanniVasundhra Oriana Horiuchi, MD  01/19/2016, 9:59 PM

## 2016-01-20 LAB — CBC WITH DIFFERENTIAL/PLATELET
BASOS PCT: 0 %
Band Neutrophils: 0 %
Basophils Absolute: 0 10*3/uL (ref 0.0–0.1)
Blasts: 0 %
EOS PCT: 0 %
Eosinophils Absolute: 0 10*3/uL (ref 0.0–0.7)
HCT: 26.1 % — ABNORMAL LOW (ref 39.0–52.0)
Hemoglobin: 8.1 g/dL — ABNORMAL LOW (ref 13.0–17.0)
LYMPHS ABS: 1.6 10*3/uL (ref 0.7–4.0)
Lymphocytes Relative: 8 %
MCH: 29.7 pg (ref 26.0–34.0)
MCHC: 31 g/dL (ref 30.0–36.0)
MCV: 95.6 fL (ref 78.0–100.0)
MONO ABS: 1.2 10*3/uL — AB (ref 0.1–1.0)
MONOS PCT: 6 %
MYELOCYTES: 0 %
Metamyelocytes Relative: 0 %
NEUTROS ABS: 17.6 10*3/uL — AB (ref 1.7–7.7)
NEUTROS PCT: 86 %
NRBC: 0 /100{WBCs}
Other: 0 %
PLATELETS: 478 10*3/uL — AB (ref 150–400)
Promyelocytes Absolute: 0 %
RBC: 2.73 MIL/uL — AB (ref 4.22–5.81)
RDW: 18.4 % — AB (ref 11.5–15.5)
WBC: 20.4 10*3/uL — AB (ref 4.0–10.5)

## 2016-01-20 LAB — BLOOD GAS, ARTERIAL
Acid-base deficit: 6.8 mmol/L — ABNORMAL HIGH (ref 0.0–2.0)
BICARBONATE: 16.9 meq/L — AB (ref 20.0–24.0)
FIO2: 0.7
O2 SAT: 99.6 %
PATIENT TEMPERATURE: 98.6
PCO2 ART: 26.5 mmHg — AB (ref 35.0–45.0)
PEEP: 5 cmH2O
PH ART: 7.42 (ref 7.350–7.450)
PO2 ART: 197 mmHg — AB (ref 80.0–100.0)
RATE: 16 resp/min
TCO2: 17.7 mmol/L (ref 0–100)
VT: 550 mL

## 2016-01-20 LAB — C-REACTIVE PROTEIN: CRP: 6.8 mg/dL — ABNORMAL HIGH (ref <1.0)

## 2016-01-20 LAB — RENAL FUNCTION PANEL
Albumin: 1.2 g/dL — ABNORMAL LOW (ref 3.5–5.0)
Anion gap: 13 (ref 5–15)
BUN: 54 mg/dL — AB (ref 6–20)
CALCIUM: 7.8 mg/dL — AB (ref 8.9–10.3)
CHLORIDE: 111 mmol/L (ref 101–111)
CO2: 17 mmol/L — AB (ref 22–32)
Creatinine, Ser: 2.4 mg/dL — ABNORMAL HIGH (ref 0.61–1.24)
GFR calc Af Amer: 31 mL/min — ABNORMAL LOW (ref >60)
GFR calc non Af Amer: 26 mL/min — ABNORMAL LOW (ref >60)
GLUCOSE: 363 mg/dL — AB (ref 65–99)
PHOSPHORUS: 3.8 mg/dL (ref 2.5–4.6)
Potassium: 5.1 mmol/L (ref 3.5–5.1)
SODIUM: 141 mmol/L (ref 135–145)

## 2016-01-20 LAB — COMPREHENSIVE METABOLIC PANEL
ALK PHOS: 77 U/L (ref 38–126)
ALT: 21 U/L (ref 17–63)
AST: 44 U/L — AB (ref 15–41)
Albumin: 1.4 g/dL — ABNORMAL LOW (ref 3.5–5.0)
Anion gap: 8 (ref 5–15)
BUN: 49 mg/dL — AB (ref 6–20)
CALCIUM: 8 mg/dL — AB (ref 8.9–10.3)
CHLORIDE: 111 mmol/L (ref 101–111)
CO2: 24 mmol/L (ref 22–32)
CREATININE: 1.95 mg/dL — AB (ref 0.61–1.24)
GFR, EST AFRICAN AMERICAN: 39 mL/min — AB (ref >60)
GFR, EST NON AFRICAN AMERICAN: 34 mL/min — AB (ref >60)
Glucose, Bld: 284 mg/dL — ABNORMAL HIGH (ref 65–99)
Potassium: 5.1 mmol/L (ref 3.5–5.1)
Sodium: 143 mmol/L (ref 135–145)
Total Bilirubin: 0.5 mg/dL (ref 0.3–1.2)
Total Protein: 4.9 g/dL — ABNORMAL LOW (ref 6.5–8.1)

## 2016-01-20 LAB — SEDIMENTATION RATE: Sed Rate: 90 mm/hr — ABNORMAL HIGH (ref 0–16)

## 2016-01-20 LAB — MAGNESIUM: Magnesium: 2.2 mg/dL (ref 1.7–2.4)

## 2016-01-20 MED FILL — Medication: Qty: 1 | Status: AC

## 2016-01-20 NOTE — H&P (Signed)
Chief Complaint: Sepis VDRF  Referring Physician(s): Hijazi  Supervising Physician: Oley BalmHassell, Daniel  Patient Status: Inpatient  History of Present Illness: Lawrence PandaGerald E Suchy Jr. is a 65 y.o. 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  He developed sepsis and ventilator dependent respiratory failure.  He remains intubated and on the vent  We are asked to place a percutaneous gastrostomy tube.  He currently has an elevated WBC (20)  Nurse reported he coded last night and is on pressors for blood pressure.  No past medical history on file.  Past Surgical History  Procedure Laterality Date  . Amputation Right 01/09/2016    Procedure: Right Above Knee Amputation;  Surgeon: Nadara MustardMarcus V Duda, MD;  [facility];  Service: Orthopedics;  Laterality: Right;    Allergies: Sulfa antibiotics  Medications: Prior to Admission medications   Not on File     No family history on file.  Social History   Social History  . Marital Status: Divorced    Spouse Name: N/A  . Number of Children: N/A  . Years of Education: N/A   Social History Main Topics  . Smoking status: Not on file  . Smokeless tobacco: Not on file  . Alcohol Use: Not on file  . Drug Use: Not on file  . Sexual Activity: Not on file   Other Topics Concern  . Not on file   Social History Narrative  . No narrative on file    Review of Systems  Unable to perform ROS: Intubated    Vital Signs: BP 100/61 mmHg  Pulse 71  Temp(Src) 97.3 F (36.3 C)  Resp 14  SpO2 100%  Physical Exam  Constitutional: He appears well-developed and well-nourished.  HENT:  Head: Atraumatic.  Cardiovascular: Normal rate, regular rhythm and normal heart sounds.   Pulmonary/Chest: Breath sounds normal.  Intubated on vent  Abdominal: Soft. Bowel sounds are normal. He exhibits no distension. There is no tenderness.  Neurological:  Sedated    Mallampati Score:  MD  Evaluation Airway: Other (comments) Airway comments: Intubated on vent Heart: WNL Abdomen: WNL Chest/ Lungs: WNL ASA  Classification: 3 Mallampati/Airway Score:  (Intubated on vent)  Imaging: Ct Abdomen Wo Contrast  01/19/2016  CLINICAL DATA:  Dysphagia, evaluate anatomy for gastrostomy tube placement. EXAM: CT ABDOMEN WITHOUT CONTRAST TECHNIQUE: Multidetector CT imaging of the abdomen was performed following the standard protocol without IV contrast. COMPARISON:  Radiograph of January 18, 2016. FINDINGS: Mild degenerative changes are noted in the lower lumbar spine. Moderate bilateral pleural effusions are noted with adjacent subsegmental atelectasis. Distal tip of nasogastric tube appears to be in the gastric antrum or proximal duodenum. No focal abnormality is noted in the liver, spleen or pancreas on these unenhanced images. Adrenal glands and kidneys are unremarkable. No hydronephrosis or renal obstruction is noted. Moderate anasarca is noted. Atherosclerosis of abdominal aorta is noted. Mild amount of free fluid is noted in the visualized portion of the pelvis. No significant adenopathy is noted. IMPRESSION: Moderate bilateral pleural effusions are noted with adjacent subsegmental atelectasis. Moderate anasarca is noted. Aortic atherosclerosis. Mild amount of free fluid seen in visualized portion of the pelvis. Distal tip of nasogastric tube appears to be in the gastric antrum or proximal duodenum. Electronically Signed   By: Lupita RaiderJames  Green Jr, M.D.   On: 01/19/2016 11:45   Mr Knee Right Wo Contrast  01/05/2016  CLINICAL DATA:  Infection stump of the right lower leg. EXAM: MRI OF  THE RIGHT KNEE WITHOUT CONTRAST TECHNIQUE: Multiplanar, multisequence MR imaging of the knee was performed. No intravenous contrast was administered. COMPARISON:  MRI dated 12/19/2015 FINDINGS: Soft tissues: There is a soft tissue ulceration with air extending to the stump of the proximal tibia. There is what is most likely  devitalized soft tissue along the lateral aspect of the distal tibial stump. There is osteomyelitis of the stump with a sequestrum best seen on images 26 and 27 of series 11. This extends 17 mm deep to the stump of the tibia. There is subcutaneous edema around the knee, most prominent laterally. No appreciable internal derangement of the right knee. IMPRESSION: Osteomyelitis of the stump of the tibia with a bony sequestrum as described above. Soft tissue ulcer extends to the stump of the tibia. Devitalized soft tissue lateral to the tibial stump. Electronically Signed   By: Francene BoyersJames  Maxwell M.D.   On: 01/05/2016 16:28   Dg Chest Port 1 View  01/19/2016  CLINICAL DATA:  Respiratory failure EXAM: PORTABLE CHEST 1 VIEW COMPARISON:  01/18/2016 FINDINGS: Endotracheal tube tip is just below the clavicular heads, 7 cm above the carina. The nasogastric tube extends below the diaphragm and beyond the inferior edge of the image. The left upper extremity PICC line extends into the low SVC. There is a percutaneous pacing patch superimposing the upper left hemi thorax. Right pleural effusion persists. No pneumothorax is evident. IMPRESSION: 1.  Support equipment appears satisfactorily positioned. 2. Unchanged right pleural effusion.  No pneumothorax. Electronically Signed   By: Ellery Plunkaniel R Mitchell M.D.   On: 01/19/2016 23:09   Dg Chest Port 1 View  01/18/2016  CLINICAL DATA:  Tachypnea, pneumonia EXAM: PORTABLE CHEST 1 VIEW COMPARISON:  Portable exam 0940 hours compared 01/12/2016 FINDINGS: Nasogastric tube extends into stomach. LEFT arm PICC line tip projects over SVC. Normal heart size, mediastinal contours, pulmonary vascularity. Atherosclerotic calcification aorta. Persistent RIGHT pleural effusion. Bibasilar opacities which could represent atelectasis or consolidation. No pneumothorax. IMPRESSION: Persistent RIGHT pleural effusion and bibasilar opacities question atelectasis versus infiltrate. Electronically Signed   By:  Ulyses SouthwardMark  Boles M.D.   On: 01/18/2016 10:19   Dg Chest Port 1 View  01/12/2016  CLINICAL DATA:  65 year old male with bilateral DVT. Recent shortness of breath. Initial encounter. EXAM: PORTABLE CHEST 1 VIEW COMPARISON:  01/09/2016 and earlier. FINDINGS: Portable AP semi upright view at at 0908 hours. Stable left upper extremity PICC line. Enteric tube now in place and courses to the abdomen, tip not included. No endotracheal tube. Continued bilateral veiling opacity at both lung bases with dense retrocardiac opacity and obscuration of the diaphragm. No pneumothorax. Upper lobe pulmonary vascularity appears stable and within normal limits. Stable cardiac size and mediastinal contours. IMPRESSION: 1. Enteric tube placed, courses to the abdomen with tip not included. 2. Continued bilateral pleural effusions with bibasilar atelectasis versus consolidation. Electronically Signed   By: Odessa FlemingH  Hall M.D.   On: 01/12/2016 09:20   Dg Chest Port 1 View  01/09/2016  CLINICAL DATA:  Patient with PICC line. EXAM: PORTABLE CHEST 1 VIEW COMPARISON:  Chest radiograph 12/23/2015. FINDINGS: Left upper extremity PICC line tip projects over the superior vena cava. Interval removal right IJ central venous catheter. Stable cardiac and mediastinal contours. Layering small to moderate bilateral pleural effusions with underlying opacities. No pneumothorax. IMPRESSION: Left upper extremity PICC line tip projects over the superior vena cava. Moderate layering bilateral pleural effusions with underlying opacities favored to represent atelectasis. Electronically Signed   By: Annia Beltrew  Davis  M.D.   On: 01/09/2016 18:40   Dg Chest Port 1 View  12/23/2015  CLINICAL DATA:  Left arm PICC line placement. EXAM: PORTABLE CHEST 1 VIEW COMPARISON:  12/17/2015 FINDINGS: New left arm PICC line is seen with tip overlying the superior cavoatrial junction. The existing right jugular PICC line remains in appropriate position. No pneumothorax visualized. Increased  size of bilateral pleural effusions is seen, right side greater than left, with associated atelectasis or infiltrate. Heart size remains normal. IMPRESSION: New left arm PICC line and existing right-sided PICC line in appropriate position. Increased bilateral pleural effusions with associated atelectasis or infiltrate, right side greater than left. Electronically Signed   By: Myles Rosenthal M.D.   On: 12/23/2015 15:52   Dg Abd Portable 1v  01/18/2016  CLINICAL DATA:  Abdominal distension EXAM: PORTABLE ABDOMEN - 1 VIEW COMPARISON:  01/10/2016 FINDINGS: There is normal small bowel gas pattern. NG tube with tip in distal stomach. There is moderate gaseous distension of transverse colon. Stable ovoid contrast collection or calcification in right abdomen. IMPRESSION: There is normal small bowel gas pattern. NG tube with tip in distal stomach. There is moderate gaseous distension of transverse colon. Stable ovoid contrast collection or calcification in right abdomen. Electronically Signed   By: Natasha Mead M.D.   On: 01/18/2016 10:12   Dg Abd Portable 1v  01/10/2016  CLINICAL DATA:  NG tube placement. EXAM: PORTABLE ABDOMEN - 1 VIEW COMPARISON:  01/10/2016 at 13:34 p.m. FINDINGS: Evidence of patient's a NG tube with tip over the right upper quadrant likely over the distal stomach or proximal duodenum. Bowel gas pattern is nonobstructive. Remainder of the exam is unchanged. IMPRESSION: Nonobstructive bowel gas pattern. Nasogastric tube with tip over the right upper quadrant likely over the distal stomach or proximal duodenum. Electronically Signed   By: Elberta Fortis M.D.   On: 01/10/2016 21:06   Dg Abd Portable 1v  01/10/2016  CLINICAL DATA:  Patient status post NG tube placement. EXAM: PORTABLE ABDOMEN - 1 VIEW COMPARISON:  Chest radiograph 01/09/2016. FINDINGS: Enteric tube tip and side-port project over the stomach. Multiple nonspecific calcific densities left hemi abdomen. Unremarkable bowel gas pattern.  Layering left pleural effusion. Underlying pulmonary opacities favored represent atelectasis. IMPRESSION: Enteric tube tip and side-port project over the stomach. Electronically Signed   By: Annia Belt M.D.   On: 01/10/2016 14:12    Labs:  CBC:  Recent Labs  01/19/16 0627 01/19/16 1718 01/19/16 2316 01/20/16 1139  WBC 14.8* 14.7* 19.6* 20.4*  HGB 8.6* 7.9* 9.6* 8.1*  HCT 27.3* 26.1* 31.2* 26.1*  PLT 390 403* 433* 478*    COAGS:  Recent Labs  12/18/15 0600 01/09/16 2330 01/19/16 1718  INR 1.23 1.36 1.20    BMP:  Recent Labs  01/19/16 0627 01/19/16 1718 01/19/16 2316 01/20/16 1140  NA 145 143 143 141  K 4.2 5.4* 5.1 5.1  CL 113* 111 111 111  CO2 26 27 24  17*  GLUCOSE 104* 217* 284* 363*  BUN 42* 48* 49* 54*  CALCIUM 7.9* 7.6* 8.0* 7.8*  CREATININE 1.68* 1.76* 1.95* 2.40*  GFRNONAA 40* 38* 34* 26*  GFRAA 47* 44* 39* 31*    LIVER FUNCTION TESTS:  Recent Labs  01/16/16 0707 01/17/16 0557  01/19/16 0627 01/19/16 1718 01/19/16 2316 01/20/16 1140  BILITOT 0.4 0.5  --  0.4  --  0.5  --   AST 16 17  --  17  --  44*  --   ALT 8*  9*  --  10*  --  21  --   ALKPHOS 55 55  --  51  --  77  --   PROT 4.3* 4.3*  --  4.3*  --  4.9*  --   ALBUMIN 1.2* 1.2*  < > 1.3* 1.2* 1.4* 1.2*  < > = values in this interval not displayed.  TUMOR MARKERS: No results for input(s): AFPTM, CEA, CA199, CHROMGRNA in the last 8760 hours.  Assessment and Plan:  Septic shock  Coded on 2023/02/07 and now on pressors.  WBC 20  Per CT scan, pt is good candidate anatomically for perc gtube, however given WBC and recent cardiac/respiratory event, will hold off until he is more stable and WBC has normalized.  Risks and Benefits discussed with the patient's uncle including, but not limited to the need for a barium enema during the procedure, bleeding, infection, peritonitis, or damage to adjacent structures.  All of the patient's uncle's questions were answered, patient is agreeable to  proceed. Consent signed and in IR.  Thank you for this interesting consult.  I greatly enjoyed meeting Maliq Pilley. and look forward to participating in their care.  A copy of this report was sent to the requesting provider on this date.  Electronically Signed: Gwynneth Macleod PA-C 01/20/2016, 2:38 PM   I spent a total of 20 Minutes in face to face in clinical consultation, greater than 50% of which was counseling/coordinating care for gtube.

## 2016-01-21 ENCOUNTER — Other Ambulatory Visit (HOSPITAL_COMMUNITY): Payer: Self-pay

## 2016-01-21 LAB — BLOOD GAS, ARTERIAL
ACID-BASE DEFICIT: 4 mmol/L — AB (ref 0.0–2.0)
Bicarbonate: 19.1 mEq/L — ABNORMAL LOW (ref 20.0–24.0)
FIO2: 0.28
LHR: 16 {breaths}/min
MECHVT: 550 mL
O2 SAT: 98.9 %
PATIENT TEMPERATURE: 98.6
PEEP/CPAP: 5 cmH2O
PH ART: 7.468 — AB (ref 7.350–7.450)
PO2 ART: 114 mmHg — AB (ref 80.0–100.0)
TCO2: 19.9 mmol/L (ref 0–100)
pCO2 arterial: 26.7 mmHg — ABNORMAL LOW (ref 35.0–45.0)

## 2016-01-21 LAB — RENAL FUNCTION PANEL
ALBUMIN: 1.1 g/dL — AB (ref 3.5–5.0)
ANION GAP: 11 (ref 5–15)
BUN: 60 mg/dL — AB (ref 6–20)
CO2: 18 mmol/L — AB (ref 22–32)
CREATININE: 2.66 mg/dL — AB (ref 0.61–1.24)
Calcium: 7.5 mg/dL — ABNORMAL LOW (ref 8.9–10.3)
Chloride: 112 mmol/L — ABNORMAL HIGH (ref 101–111)
GFR calc Af Amer: 27 mL/min — ABNORMAL LOW (ref >60)
GFR calc non Af Amer: 23 mL/min — ABNORMAL LOW (ref >60)
GLUCOSE: 351 mg/dL — AB (ref 65–99)
POTASSIUM: 4.9 mmol/L (ref 3.5–5.1)
Phosphorus: 3.2 mg/dL (ref 2.5–4.6)
SODIUM: 141 mmol/L (ref 135–145)

## 2016-01-21 LAB — CBC
HEMATOCRIT: 23.6 % — AB (ref 39.0–52.0)
Hemoglobin: 7.5 g/dL — ABNORMAL LOW (ref 13.0–17.0)
MCH: 29.8 pg (ref 26.0–34.0)
MCHC: 31.8 g/dL (ref 30.0–36.0)
MCV: 93.7 fL (ref 78.0–100.0)
PLATELETS: 419 10*3/uL — AB (ref 150–400)
RBC: 2.52 MIL/uL — ABNORMAL LOW (ref 4.22–5.81)
RDW: 18.1 % — AB (ref 11.5–15.5)
WBC: 21.5 10*3/uL — AB (ref 4.0–10.5)

## 2016-01-21 LAB — MAGNESIUM: Magnesium: 2.1 mg/dL (ref 1.7–2.4)

## 2016-01-21 NOTE — Progress Notes (Signed)
  WBC higher today at 21  Will hold off on gastrostomy tube placement until WBC normalizes  WENDY S BLAIR PA-C 01/21/2016 12:34 PM

## 2016-01-22 LAB — RENAL FUNCTION PANEL
ALBUMIN: 1.3 g/dL — AB (ref 3.5–5.0)
Anion gap: 8 (ref 5–15)
BUN: 58 mg/dL — AB (ref 6–20)
CHLORIDE: 112 mmol/L — AB (ref 101–111)
CO2: 23 mmol/L (ref 22–32)
CREATININE: 2.43 mg/dL — AB (ref 0.61–1.24)
Calcium: 7.6 mg/dL — ABNORMAL LOW (ref 8.9–10.3)
GFR, EST AFRICAN AMERICAN: 30 mL/min — AB (ref >60)
GFR, EST NON AFRICAN AMERICAN: 26 mL/min — AB (ref >60)
Glucose, Bld: 105 mg/dL — ABNORMAL HIGH (ref 65–99)
PHOSPHORUS: 2.3 mg/dL — AB (ref 2.5–4.6)
POTASSIUM: 4.3 mmol/L (ref 3.5–5.1)
Sodium: 143 mmol/L (ref 135–145)

## 2016-01-22 LAB — CBC WITH DIFFERENTIAL/PLATELET
BASOS ABS: 0 10*3/uL (ref 0.0–0.1)
BASOS PCT: 0 %
Eosinophils Absolute: 0.6 10*3/uL (ref 0.0–0.7)
Eosinophils Relative: 3 %
HEMATOCRIT: 24.6 % — AB (ref 39.0–52.0)
Hemoglobin: 7.9 g/dL — ABNORMAL LOW (ref 13.0–17.0)
LYMPHS PCT: 11 %
Lymphs Abs: 2.6 10*3/uL (ref 0.7–4.0)
MCH: 30.5 pg (ref 26.0–34.0)
MCHC: 32.1 g/dL (ref 30.0–36.0)
MCV: 95 fL (ref 78.0–100.0)
Monocytes Absolute: 3 10*3/uL — ABNORMAL HIGH (ref 0.1–1.0)
Monocytes Relative: 13 %
NEUTROS ABS: 17.6 10*3/uL — AB (ref 1.7–7.7)
Neutrophils Relative %: 74 %
PLATELETS: 378 10*3/uL (ref 150–400)
RBC: 2.59 MIL/uL — AB (ref 4.22–5.81)
RDW: 18.1 % — ABNORMAL HIGH (ref 11.5–15.5)
WBC: 23.8 10*3/uL — AB (ref 4.0–10.5)

## 2016-01-22 LAB — MAGNESIUM: MAGNESIUM: 2.1 mg/dL (ref 1.7–2.4)

## 2016-01-23 LAB — CBC WITH DIFFERENTIAL/PLATELET
BASOS ABS: 0 10*3/uL (ref 0.0–0.1)
BASOS PCT: 0 %
EOS ABS: 0.7 10*3/uL (ref 0.0–0.7)
Eosinophils Relative: 3 %
HCT: 24.1 % — ABNORMAL LOW (ref 39.0–52.0)
HEMOGLOBIN: 7.7 g/dL — AB (ref 13.0–17.0)
Lymphocytes Relative: 8 %
Lymphs Abs: 1.9 10*3/uL (ref 0.7–4.0)
MCH: 30.4 pg (ref 26.0–34.0)
MCHC: 32 g/dL (ref 30.0–36.0)
MCV: 95.3 fL (ref 78.0–100.0)
Monocytes Absolute: 2.1 10*3/uL — ABNORMAL HIGH (ref 0.1–1.0)
Monocytes Relative: 10 %
NEUTROS PCT: 79 %
Neutro Abs: 17.2 10*3/uL — ABNORMAL HIGH (ref 1.7–7.7)
Platelets: 358 10*3/uL (ref 150–400)
RBC: 2.53 MIL/uL — AB (ref 4.22–5.81)
RDW: 18.3 % — ABNORMAL HIGH (ref 11.5–15.5)
WBC: 21.9 10*3/uL — AB (ref 4.0–10.5)

## 2016-01-23 LAB — BLOOD GAS, ARTERIAL
Acid-base deficit: 0.1 mmol/L (ref 0.0–2.0)
Bicarbonate: 24.3 mEq/L — ABNORMAL HIGH (ref 20.0–24.0)
FIO2: 0.4
MECHVT: 500 mL
O2 SAT: 97.4 %
PCO2 ART: 38.6 mmHg (ref 35.0–45.0)
PEEP: 5 cmH2O
PH ART: 7.408 (ref 7.350–7.450)
PO2 ART: 85.8 mmHg (ref 80.0–100.0)
Patient temperature: 96.2
RATE: 14 resp/min
TCO2: 25.5 mmol/L (ref 0–100)

## 2016-01-23 LAB — RENAL FUNCTION PANEL
ALBUMIN: 1.1 g/dL — AB (ref 3.5–5.0)
ANION GAP: 8 (ref 5–15)
BUN: 63 mg/dL — ABNORMAL HIGH (ref 6–20)
CALCIUM: 7.4 mg/dL — AB (ref 8.9–10.3)
CO2: 24 mmol/L (ref 22–32)
Chloride: 110 mmol/L (ref 101–111)
Creatinine, Ser: 2.39 mg/dL — ABNORMAL HIGH (ref 0.61–1.24)
GFR calc non Af Amer: 26 mL/min — ABNORMAL LOW (ref >60)
GFR, EST AFRICAN AMERICAN: 31 mL/min — AB (ref >60)
GLUCOSE: 87 mg/dL (ref 65–99)
PHOSPHORUS: 2.7 mg/dL (ref 2.5–4.6)
Potassium: 3.3 mmol/L — ABNORMAL LOW (ref 3.5–5.1)
SODIUM: 142 mmol/L (ref 135–145)

## 2016-01-23 LAB — PHOSPHORUS: PHOSPHORUS: 2.6 mg/dL (ref 2.5–4.6)

## 2016-01-23 LAB — MAGNESIUM: Magnesium: 1.9 mg/dL (ref 1.7–2.4)

## 2016-01-24 LAB — CBC WITH DIFFERENTIAL/PLATELET
BASOS ABS: 0 10*3/uL (ref 0.0–0.1)
BASOS PCT: 0 %
EOS ABS: 0.8 10*3/uL — AB (ref 0.0–0.7)
Eosinophils Relative: 5 %
HCT: 22 % — ABNORMAL LOW (ref 39.0–52.0)
HEMOGLOBIN: 7.1 g/dL — AB (ref 13.0–17.0)
LYMPHS ABS: 1.2 10*3/uL (ref 0.7–4.0)
Lymphocytes Relative: 8 %
MCH: 30.3 pg (ref 26.0–34.0)
MCHC: 32.3 g/dL (ref 30.0–36.0)
MCV: 94 fL (ref 78.0–100.0)
Monocytes Absolute: 1.1 10*3/uL — ABNORMAL HIGH (ref 0.1–1.0)
Monocytes Relative: 7 %
NEUTROS PCT: 80 %
Neutro Abs: 12.1 10*3/uL — ABNORMAL HIGH (ref 1.7–7.7)
PLATELETS: 357 10*3/uL (ref 150–400)
RBC: 2.34 MIL/uL — AB (ref 4.22–5.81)
RDW: 18.3 % — AB (ref 11.5–15.5)
WBC: 15.2 10*3/uL — AB (ref 4.0–10.5)

## 2016-01-24 LAB — RENAL FUNCTION PANEL
Albumin: <1 g/dL — ABNORMAL LOW (ref 3.5–5.0)
Anion gap: 10 (ref 5–15)
BUN: 63 mg/dL — AB (ref 6–20)
CHLORIDE: 108 mmol/L (ref 101–111)
CO2: 23 mmol/L (ref 22–32)
Calcium: 7.3 mg/dL — ABNORMAL LOW (ref 8.9–10.3)
Creatinine, Ser: 2.34 mg/dL — ABNORMAL HIGH (ref 0.61–1.24)
GFR calc Af Amer: 31 mL/min — ABNORMAL LOW (ref >60)
GFR calc non Af Amer: 27 mL/min — ABNORMAL LOW (ref >60)
GLUCOSE: 223 mg/dL — AB (ref 65–99)
POTASSIUM: 3.2 mmol/L — AB (ref 3.5–5.1)
Phosphorus: 2.6 mg/dL (ref 2.5–4.6)
Sodium: 141 mmol/L (ref 135–145)

## 2016-01-24 LAB — MAGNESIUM: MAGNESIUM: 2 mg/dL (ref 1.7–2.4)

## 2016-01-24 LAB — TSH: TSH: 1.533 u[IU]/mL (ref 0.350–4.500)

## 2016-01-24 LAB — BRAIN NATRIURETIC PEPTIDE: B Natriuretic Peptide: 307.2 pg/mL — ABNORMAL HIGH (ref 0.0–100.0)

## 2016-01-25 ENCOUNTER — Other Ambulatory Visit (HOSPITAL_COMMUNITY): Payer: Self-pay

## 2016-01-25 LAB — CBC WITH DIFFERENTIAL/PLATELET
BASOS ABS: 0 10*3/uL (ref 0.0–0.1)
BASOS PCT: 0 %
EOS ABS: 0.7 10*3/uL (ref 0.0–0.7)
EOS PCT: 5 %
HCT: 21.9 % — ABNORMAL LOW (ref 39.0–52.0)
Hemoglobin: 7 g/dL — ABNORMAL LOW (ref 13.0–17.0)
LYMPHS PCT: 10 %
Lymphs Abs: 1.4 10*3/uL (ref 0.7–4.0)
MCH: 30 pg (ref 26.0–34.0)
MCHC: 32 g/dL (ref 30.0–36.0)
MCV: 94 fL (ref 78.0–100.0)
MONO ABS: 1.2 10*3/uL — AB (ref 0.1–1.0)
Monocytes Relative: 8 %
Neutro Abs: 11.5 10*3/uL — ABNORMAL HIGH (ref 1.7–7.7)
Neutrophils Relative %: 77 %
PLATELETS: 430 10*3/uL — AB (ref 150–400)
RBC: 2.33 MIL/uL — AB (ref 4.22–5.81)
RDW: 18.7 % — AB (ref 11.5–15.5)
WBC: 14.8 10*3/uL — AB (ref 4.0–10.5)

## 2016-01-25 LAB — RENAL FUNCTION PANEL
ALBUMIN: 1 g/dL — AB (ref 3.5–5.0)
Anion gap: 9 (ref 5–15)
BUN: 61 mg/dL — AB (ref 6–20)
CO2: 24 mmol/L (ref 22–32)
CREATININE: 2.29 mg/dL — AB (ref 0.61–1.24)
Calcium: 7.3 mg/dL — ABNORMAL LOW (ref 8.9–10.3)
Chloride: 108 mmol/L (ref 101–111)
GFR calc Af Amer: 32 mL/min — ABNORMAL LOW (ref >60)
GFR, EST NON AFRICAN AMERICAN: 28 mL/min — AB (ref >60)
GLUCOSE: 187 mg/dL — AB (ref 65–99)
PHOSPHORUS: 2.2 mg/dL — AB (ref 2.5–4.6)
Potassium: 3.1 mmol/L — ABNORMAL LOW (ref 3.5–5.1)
SODIUM: 141 mmol/L (ref 135–145)

## 2016-01-25 LAB — CULTURE, RESPIRATORY

## 2016-01-25 LAB — CULTURE, BLOOD (ROUTINE X 2)
CULTURE: NO GROWTH
Culture: NO GROWTH

## 2016-01-25 LAB — CULTURE, RESPIRATORY W GRAM STAIN

## 2016-01-25 LAB — MAGNESIUM: MAGNESIUM: 1.8 mg/dL (ref 1.7–2.4)

## 2016-01-26 LAB — RENAL FUNCTION PANEL
Albumin: 1.2 g/dL — ABNORMAL LOW (ref 3.5–5.0)
Anion gap: 17 — ABNORMAL HIGH (ref 5–15)
BUN: 60 mg/dL — AB (ref 6–20)
CALCIUM: 8.4 mg/dL — AB (ref 8.9–10.3)
CO2: 25 mmol/L (ref 22–32)
CREATININE: 2.24 mg/dL — AB (ref 0.61–1.24)
Chloride: 104 mmol/L (ref 101–111)
GFR calc non Af Amer: 29 mL/min — ABNORMAL LOW (ref >60)
GFR, EST AFRICAN AMERICAN: 33 mL/min — AB (ref >60)
Glucose, Bld: 113 mg/dL — ABNORMAL HIGH (ref 65–99)
Phosphorus: 2.4 mg/dL — ABNORMAL LOW (ref 2.5–4.6)
Potassium: 3.3 mmol/L — ABNORMAL LOW (ref 3.5–5.1)
SODIUM: 146 mmol/L — AB (ref 135–145)

## 2016-01-26 LAB — CBC WITH DIFFERENTIAL/PLATELET
Basophils Absolute: 0 10*3/uL (ref 0.0–0.1)
Basophils Relative: 0 %
EOS ABS: 0.6 10*3/uL (ref 0.0–0.7)
EOS PCT: 4 %
HCT: 21.5 % — ABNORMAL LOW (ref 39.0–52.0)
Hemoglobin: 6.9 g/dL — CL (ref 13.0–17.0)
LYMPHS ABS: 1.5 10*3/uL (ref 0.7–4.0)
Lymphocytes Relative: 10 %
MCH: 30.1 pg (ref 26.0–34.0)
MCHC: 32.1 g/dL (ref 30.0–36.0)
MCV: 93.9 fL (ref 78.0–100.0)
MONO ABS: 1.2 10*3/uL — AB (ref 0.1–1.0)
MONOS PCT: 8 %
Neutro Abs: 11.2 10*3/uL — ABNORMAL HIGH (ref 1.7–7.7)
Neutrophils Relative %: 78 %
PLATELETS: 404 10*3/uL — AB (ref 150–400)
RBC: 2.29 MIL/uL — AB (ref 4.22–5.81)
RDW: 19.2 % — AB (ref 11.5–15.5)
WBC: 14.6 10*3/uL — AB (ref 4.0–10.5)

## 2016-01-26 LAB — CBC
HCT: 22.5 % — ABNORMAL LOW (ref 39.0–52.0)
Hemoglobin: 7.2 g/dL — ABNORMAL LOW (ref 13.0–17.0)
MCH: 30.3 pg (ref 26.0–34.0)
MCHC: 32 g/dL (ref 30.0–36.0)
MCV: 94.5 fL (ref 78.0–100.0)
PLATELETS: 422 10*3/uL — AB (ref 150–400)
RBC: 2.38 MIL/uL — AB (ref 4.22–5.81)
RDW: 19.3 % — ABNORMAL HIGH (ref 11.5–15.5)
WBC: 15.1 10*3/uL — AB (ref 4.0–10.5)

## 2016-01-26 LAB — PREPARE RBC (CROSSMATCH)

## 2016-01-26 LAB — MAGNESIUM: Magnesium: 1.9 mg/dL (ref 1.7–2.4)

## 2016-01-26 NOTE — Progress Notes (Signed)
Patient ID: Lawrence PandaGerald E Collingsworth Jr., male   DOB: 12/04/1950, 65 y.o.   MRN: [id number]  Wbc 15  7/3 afeb  Plan for poss G tube 7/5 Check cbc/PT/ Bmet 7/5 am stat Consent signed andin chart

## 2016-01-27 ENCOUNTER — Other Ambulatory Visit (HOSPITAL_COMMUNITY): Payer: Self-pay

## 2016-01-27 LAB — RENAL FUNCTION PANEL
Albumin: 1.2 g/dL — ABNORMAL LOW (ref 3.5–5.0)
Anion gap: 7 (ref 5–15)
BUN: 60 mg/dL — AB (ref 6–20)
CHLORIDE: 109 mmol/L (ref 101–111)
CO2: 24 mmol/L (ref 22–32)
CREATININE: 2.07 mg/dL — AB (ref 0.61–1.24)
Calcium: 7.6 mg/dL — ABNORMAL LOW (ref 8.9–10.3)
GFR calc Af Amer: 36 mL/min — ABNORMAL LOW (ref >60)
GFR calc non Af Amer: 31 mL/min — ABNORMAL LOW (ref >60)
GLUCOSE: 97 mg/dL (ref 65–99)
POTASSIUM: 3.5 mmol/L (ref 3.5–5.1)
Phosphorus: 2.7 mg/dL (ref 2.5–4.6)
Sodium: 140 mmol/L (ref 135–145)

## 2016-01-27 LAB — TYPE AND SCREEN
ABO/RH(D): AB POS
ANTIBODY SCREEN: NEGATIVE
UNIT DIVISION: 0
Unit division: 0

## 2016-01-27 LAB — CBC WITH DIFFERENTIAL/PLATELET
BASOS ABS: 0 10*3/uL (ref 0.0–0.1)
BASOS PCT: 0 %
EOS ABS: 0.4 10*3/uL (ref 0.0–0.7)
Eosinophils Relative: 2 %
HEMATOCRIT: 27.8 % — AB (ref 39.0–52.0)
HEMOGLOBIN: 8.9 g/dL — AB (ref 13.0–17.0)
Lymphocytes Relative: 8 %
Lymphs Abs: 1.4 10*3/uL (ref 0.7–4.0)
MCH: 30.1 pg (ref 26.0–34.0)
MCHC: 32 g/dL (ref 30.0–36.0)
MCV: 93.9 fL (ref 78.0–100.0)
MONOS PCT: 8 %
Monocytes Absolute: 1.4 10*3/uL — ABNORMAL HIGH (ref 0.1–1.0)
NEUTROS ABS: 14.4 10*3/uL — AB (ref 1.7–7.7)
NEUTROS PCT: 82 %
Platelets: 466 10*3/uL — ABNORMAL HIGH (ref 150–400)
RBC: 2.96 MIL/uL — ABNORMAL LOW (ref 4.22–5.81)
RDW: 18.7 % — AB (ref 11.5–15.5)
WBC: 17.6 10*3/uL — ABNORMAL HIGH (ref 4.0–10.5)

## 2016-01-27 LAB — C-REACTIVE PROTEIN: CRP: 5.7 mg/dL — AB (ref <1.0)

## 2016-01-27 LAB — SEDIMENTATION RATE: SED RATE: 80 mm/h — AB (ref 0–16)

## 2016-01-27 LAB — MAGNESIUM: MAGNESIUM: 1.9 mg/dL (ref 1.7–2.4)

## 2016-01-28 ENCOUNTER — Other Ambulatory Visit (HOSPITAL_COMMUNITY): Payer: Self-pay

## 2016-01-28 LAB — CBC WITH DIFFERENTIAL/PLATELET
BASOS ABS: 0 10*3/uL (ref 0.0–0.1)
BASOS PCT: 0 %
EOS ABS: 0.5 10*3/uL (ref 0.0–0.7)
Eosinophils Relative: 3 %
HEMATOCRIT: 26.5 % — AB (ref 39.0–52.0)
HEMOGLOBIN: 8.6 g/dL — AB (ref 13.0–17.0)
Lymphocytes Relative: 9 %
Lymphs Abs: 1.4 10*3/uL (ref 0.7–4.0)
MCH: 31.3 pg (ref 26.0–34.0)
MCHC: 32.5 g/dL (ref 30.0–36.0)
MCV: 96.4 fL (ref 78.0–100.0)
Monocytes Absolute: 1.3 10*3/uL — ABNORMAL HIGH (ref 0.1–1.0)
Monocytes Relative: 9 %
NEUTROS ABS: 12.1 10*3/uL — AB (ref 1.7–7.7)
NEUTROS PCT: 79 %
Platelets: 464 10*3/uL — ABNORMAL HIGH (ref 150–400)
RBC: 2.75 MIL/uL — AB (ref 4.22–5.81)
RDW: 18.8 % — ABNORMAL HIGH (ref 11.5–15.5)
WBC: 15.4 10*3/uL — AB (ref 4.0–10.5)

## 2016-01-28 LAB — BASIC METABOLIC PANEL
ANION GAP: 9 (ref 5–15)
BUN: 61 mg/dL — ABNORMAL HIGH (ref 6–20)
CALCIUM: 7.8 mg/dL — AB (ref 8.9–10.3)
CO2: 24 mmol/L (ref 22–32)
CREATININE: 2.06 mg/dL — AB (ref 0.61–1.24)
Chloride: 105 mmol/L (ref 101–111)
GFR, EST AFRICAN AMERICAN: 37 mL/min — AB (ref >60)
GFR, EST NON AFRICAN AMERICAN: 32 mL/min — AB (ref >60)
Glucose, Bld: 125 mg/dL — ABNORMAL HIGH (ref 65–99)
Potassium: 4 mmol/L (ref 3.5–5.1)
SODIUM: 138 mmol/L (ref 135–145)

## 2016-01-28 LAB — RENAL FUNCTION PANEL
ALBUMIN: 1.3 g/dL — AB (ref 3.5–5.0)
ANION GAP: 8 (ref 5–15)
BUN: 60 mg/dL — ABNORMAL HIGH (ref 6–20)
CALCIUM: 7.7 mg/dL — AB (ref 8.9–10.3)
CO2: 23 mmol/L (ref 22–32)
Chloride: 107 mmol/L (ref 101–111)
Creatinine, Ser: 2.04 mg/dL — ABNORMAL HIGH (ref 0.61–1.24)
GFR, EST AFRICAN AMERICAN: 37 mL/min — AB (ref >60)
GFR, EST NON AFRICAN AMERICAN: 32 mL/min — AB (ref >60)
GLUCOSE: 127 mg/dL — AB (ref 65–99)
Phosphorus: 3.5 mg/dL (ref 2.5–4.6)
Potassium: 4 mmol/L (ref 3.5–5.1)
SODIUM: 138 mmol/L (ref 135–145)

## 2016-01-28 LAB — OVA + PARASITE EXAM

## 2016-01-28 LAB — MAGNESIUM: Magnesium: 1.9 mg/dL (ref 1.7–2.4)

## 2016-01-28 LAB — PROTIME-INR
INR: 1.2 (ref 0.00–1.49)
PROTHROMBIN TIME: 15.3 s — AB (ref 11.6–15.2)

## 2016-01-28 MED ORDER — IOPAMIDOL (ISOVUE-300) INJECTION 61%
INTRAVENOUS | Status: AC
  Administered 2016-01-28: 15 mL
  Filled 2016-01-28: qty 50

## 2016-01-28 MED ORDER — FENTANYL CITRATE (PF) 100 MCG/2ML IJ SOLN
INTRAMUSCULAR | Status: AC
  Filled 2016-01-28: qty 2

## 2016-01-28 MED ORDER — MIDAZOLAM HCL 2 MG/2ML IJ SOLN
INTRAMUSCULAR | Status: AC
  Filled 2016-01-28: qty 2

## 2016-01-28 MED ORDER — LIDOCAINE HCL 1 % IJ SOLN
INTRAMUSCULAR | Status: AC
  Administered 2016-01-28: 10 mL
  Filled 2016-01-28: qty 20

## 2016-01-28 MED ORDER — CEFAZOLIN SODIUM-DEXTROSE 2-4 GM/100ML-% IV SOLN
INTRAVENOUS | Status: AC
  Filled 2016-01-28: qty 100

## 2016-01-28 NOTE — Procedures (Signed)
Interventional Radiology Procedure Note  Procedure: Placement of percutaneous 20F pull-through gastrostomy tube. Complications: None Recommendations: - NPO except for sips and chips remainder of today and overnight - Maintain G-tube to LWS until tomorrow morning  - May advance diet as tolerated and begin using tube tomorrow morning  Signed,   Jaspreet Hollings S. Ellawyn Wogan, DO   

## 2016-01-28 NOTE — Sedation Documentation (Signed)
No sedation given.

## 2016-01-28 NOTE — Sedation Documentation (Signed)
No sedation given at this time. 

## 2016-01-30 LAB — BASIC METABOLIC PANEL
Anion gap: 8 (ref 5–15)
BUN: 57 mg/dL — ABNORMAL HIGH (ref 6–20)
CALCIUM: 8.2 mg/dL — AB (ref 8.9–10.3)
CHLORIDE: 106 mmol/L (ref 101–111)
CO2: 25 mmol/L (ref 22–32)
CREATININE: 1.81 mg/dL — AB (ref 0.61–1.24)
GFR, EST AFRICAN AMERICAN: 43 mL/min — AB (ref >60)
GFR, EST NON AFRICAN AMERICAN: 37 mL/min — AB (ref >60)
Glucose, Bld: 128 mg/dL — ABNORMAL HIGH (ref 65–99)
Potassium: 3.6 mmol/L (ref 3.5–5.1)
SODIUM: 139 mmol/L (ref 135–145)

## 2016-01-30 LAB — CBC
HCT: 27.3 % — ABNORMAL LOW (ref 39.0–52.0)
HEMOGLOBIN: 9.1 g/dL — AB (ref 13.0–17.0)
MCH: 31.4 pg (ref 26.0–34.0)
MCHC: 33.3 g/dL (ref 30.0–36.0)
MCV: 94.1 fL (ref 78.0–100.0)
PLATELETS: 447 10*3/uL — AB (ref 150–400)
RBC: 2.9 MIL/uL — ABNORMAL LOW (ref 4.22–5.81)
RDW: 18.4 % — AB (ref 11.5–15.5)
WBC: 14.9 10*3/uL — ABNORMAL HIGH (ref 4.0–10.5)

## 2016-01-31 LAB — BASIC METABOLIC PANEL
Anion gap: 7 (ref 5–15)
BUN: 62 mg/dL — AB (ref 6–20)
CHLORIDE: 107 mmol/L (ref 101–111)
CO2: 24 mmol/L (ref 22–32)
CREATININE: 1.69 mg/dL — AB (ref 0.61–1.24)
Calcium: 7.8 mg/dL — ABNORMAL LOW (ref 8.9–10.3)
GFR, EST AFRICAN AMERICAN: 47 mL/min — AB (ref >60)
GFR, EST NON AFRICAN AMERICAN: 40 mL/min — AB (ref >60)
Glucose, Bld: 83 mg/dL (ref 65–99)
POTASSIUM: 3.7 mmol/L (ref 3.5–5.1)
SODIUM: 138 mmol/L (ref 135–145)

## 2016-01-31 LAB — CBC WITH DIFFERENTIAL/PLATELET
BASOS ABS: 0 10*3/uL (ref 0.0–0.1)
Basophils Relative: 0 %
EOS ABS: 0.5 10*3/uL (ref 0.0–0.7)
EOS PCT: 4 %
HCT: 25 % — ABNORMAL LOW (ref 39.0–52.0)
HEMOGLOBIN: 8.1 g/dL — AB (ref 13.0–17.0)
LYMPHS PCT: 9 %
Lymphs Abs: 1.3 10*3/uL (ref 0.7–4.0)
MCH: 30.8 pg (ref 26.0–34.0)
MCHC: 32.4 g/dL (ref 30.0–36.0)
MCV: 95.1 fL (ref 78.0–100.0)
Monocytes Absolute: 1.2 10*3/uL — ABNORMAL HIGH (ref 0.1–1.0)
Monocytes Relative: 8 %
NEUTROS PCT: 79 %
Neutro Abs: 11 10*3/uL — ABNORMAL HIGH (ref 1.7–7.7)
PLATELETS: 385 10*3/uL (ref 150–400)
RBC: 2.63 MIL/uL — AB (ref 4.22–5.81)
RDW: 18.4 % — ABNORMAL HIGH (ref 11.5–15.5)
WBC: 13.9 10*3/uL — AB (ref 4.0–10.5)

## 2016-01-31 LAB — AMMONIA: AMMONIA: 42 umol/L — AB (ref 9–35)

## 2016-02-01 LAB — CBC
HEMATOCRIT: 24.5 % — AB (ref 39.0–52.0)
HEMOGLOBIN: 8 g/dL — AB (ref 13.0–17.0)
MCH: 31.3 pg (ref 26.0–34.0)
MCHC: 32.7 g/dL (ref 30.0–36.0)
MCV: 95.7 fL (ref 78.0–100.0)
Platelets: 357 10*3/uL (ref 150–400)
RBC: 2.56 MIL/uL — ABNORMAL LOW (ref 4.22–5.81)
RDW: 18.3 % — ABNORMAL HIGH (ref 11.5–15.5)
WBC: 12.7 10*3/uL — AB (ref 4.0–10.5)

## 2016-02-02 LAB — BASIC METABOLIC PANEL
ANION GAP: 9 (ref 5–15)
BUN: 67 mg/dL — ABNORMAL HIGH (ref 6–20)
CALCIUM: 8 mg/dL — AB (ref 8.9–10.3)
CO2: 25 mmol/L (ref 22–32)
Chloride: 103 mmol/L (ref 101–111)
Creatinine, Ser: 1.58 mg/dL — ABNORMAL HIGH (ref 0.61–1.24)
GFR, EST AFRICAN AMERICAN: 51 mL/min — AB (ref >60)
GFR, EST NON AFRICAN AMERICAN: 44 mL/min — AB (ref >60)
Glucose, Bld: 116 mg/dL — ABNORMAL HIGH (ref 65–99)
POTASSIUM: 3.1 mmol/L — AB (ref 3.5–5.1)
SODIUM: 137 mmol/L (ref 135–145)

## 2016-02-02 LAB — AMMONIA: AMMONIA: 21 umol/L (ref 9–35)

## 2016-02-03 LAB — CBC WITH DIFFERENTIAL/PLATELET
BASOS PCT: 0 %
Basophils Absolute: 0 10*3/uL (ref 0.0–0.1)
EOS ABS: 0.1 10*3/uL (ref 0.0–0.7)
EOS PCT: 1 %
HEMATOCRIT: 25.1 % — AB (ref 39.0–52.0)
HEMOGLOBIN: 8.2 g/dL — AB (ref 13.0–17.0)
LYMPHS PCT: 8 %
Lymphs Abs: 1.1 10*3/uL (ref 0.7–4.0)
MCH: 31.1 pg (ref 26.0–34.0)
MCHC: 32.7 g/dL (ref 30.0–36.0)
MCV: 95.1 fL (ref 78.0–100.0)
MONOS PCT: 6 %
Monocytes Absolute: 0.8 10*3/uL (ref 0.1–1.0)
NEUTROS ABS: 11.4 10*3/uL — AB (ref 1.7–7.7)
NEUTROS PCT: 85 %
Platelets: 307 10*3/uL (ref 150–400)
RBC: 2.64 MIL/uL — ABNORMAL LOW (ref 4.22–5.81)
RDW: 18.2 % — ABNORMAL HIGH (ref 11.5–15.5)
WBC: 13.4 10*3/uL — ABNORMAL HIGH (ref 4.0–10.5)

## 2016-02-03 LAB — RENAL FUNCTION PANEL
Albumin: 1.4 g/dL — ABNORMAL LOW (ref 3.5–5.0)
Anion gap: 8 (ref 5–15)
BUN: 67 mg/dL — ABNORMAL HIGH (ref 6–20)
CO2: 29 mmol/L (ref 22–32)
Calcium: 8 mg/dL — ABNORMAL LOW (ref 8.9–10.3)
Chloride: 103 mmol/L (ref 101–111)
Creatinine, Ser: 1.58 mg/dL — ABNORMAL HIGH (ref 0.61–1.24)
GFR calc Af Amer: 51 mL/min — ABNORMAL LOW
GFR calc non Af Amer: 44 mL/min — ABNORMAL LOW
Glucose, Bld: 110 mg/dL — ABNORMAL HIGH (ref 65–99)
Phosphorus: 2.8 mg/dL (ref 2.5–4.6)
Potassium: 3.4 mmol/L — ABNORMAL LOW (ref 3.5–5.1)
Sodium: 140 mmol/L (ref 135–145)

## 2016-02-03 LAB — MAGNESIUM: Magnesium: 2 mg/dL (ref 1.7–2.4)

## 2016-02-04 ENCOUNTER — Encounter (HOSPITAL_COMMUNITY): Payer: Self-pay | Admitting: Critical Care Medicine

## 2016-02-04 ENCOUNTER — Encounter
Admission: AD | Disposition: A | Discharge status: Skilled Nursing Facility | Payer: Self-pay | Source: Ambulatory Visit | Attending: Internal Medicine

## 2016-02-04 HISTORY — PX: TRACHEOSTOMY TUBE PLACEMENT: SHX814

## 2016-02-04 LAB — RENAL FUNCTION PANEL
ALBUMIN: 1.3 g/dL — AB (ref 3.5–5.0)
Anion gap: 9 (ref 5–15)
BUN: 64 mg/dL — AB (ref 6–20)
CO2: 30 mmol/L (ref 22–32)
Calcium: 8.1 mg/dL — ABNORMAL LOW (ref 8.9–10.3)
Chloride: 102 mmol/L (ref 101–111)
Creatinine, Ser: 1.46 mg/dL — ABNORMAL HIGH (ref 0.61–1.24)
GFR calc Af Amer: 56 mL/min — ABNORMAL LOW (ref >60)
GFR calc non Af Amer: 48 mL/min — ABNORMAL LOW (ref >60)
GLUCOSE: 179 mg/dL — AB (ref 65–99)
PHOSPHORUS: 2.8 mg/dL (ref 2.5–4.6)
POTASSIUM: 3.6 mmol/L (ref 3.5–5.1)
SODIUM: 141 mmol/L (ref 135–145)

## 2016-02-04 LAB — CBC WITH DIFFERENTIAL/PLATELET
BASOS ABS: 0 10*3/uL (ref 0.0–0.1)
BASOS PCT: 0 %
EOS PCT: 3 %
Eosinophils Absolute: 0.3 10*3/uL (ref 0.0–0.7)
HCT: 26.3 % — ABNORMAL LOW (ref 39.0–52.0)
Hemoglobin: 8.6 g/dL — ABNORMAL LOW (ref 13.0–17.0)
Lymphocytes Relative: 18 %
Lymphs Abs: 1.8 10*3/uL (ref 0.7–4.0)
MCH: 31.3 pg (ref 26.0–34.0)
MCHC: 32.7 g/dL (ref 30.0–36.0)
MCV: 95.6 fL (ref 78.0–100.0)
MONO ABS: 1.1 10*3/uL — AB (ref 0.1–1.0)
Monocytes Relative: 11 %
Neutro Abs: 6.7 10*3/uL (ref 1.7–7.7)
Neutrophils Relative %: 68 %
PLATELETS: 351 10*3/uL (ref 150–400)
RBC: 2.75 MIL/uL — ABNORMAL LOW (ref 4.22–5.81)
RDW: 18.2 % — AB (ref 11.5–15.5)
WBC: 9.9 10*3/uL (ref 4.0–10.5)

## 2016-02-04 LAB — MAGNESIUM: Magnesium: 1.9 mg/dL (ref 1.7–2.4)

## 2016-02-04 SURGERY — CREATION, TRACHEOSTOMY
Anesthesia: General

## 2016-02-04 MED ORDER — ROCURONIUM BROMIDE 50 MG/5ML IV SOLN
INTRAVENOUS | Status: AC
  Filled 2016-02-04: qty 1

## 2016-02-04 MED ORDER — CEFAZOLIN SODIUM 1 G IJ SOLR
INTRAMUSCULAR | Status: DC | PRN
  Administered 2016-02-04: 2 g via INTRAMUSCULAR

## 2016-02-04 MED ORDER — LIDOCAINE-EPINEPHRINE 1 %-1:100000 IJ SOLN
INTRAMUSCULAR | Status: DC | PRN
  Administered 2016-02-04: 5 mL

## 2016-02-04 MED ORDER — ROCURONIUM BROMIDE 100 MG/10ML IV SOLN
INTRAVENOUS | Status: DC | PRN
  Administered 2016-02-04: 20 mg via INTRAVENOUS

## 2016-02-04 MED ORDER — FENTANYL CITRATE (PF) 100 MCG/2ML IJ SOLN
INTRAMUSCULAR | Status: DC | PRN
  Administered 2016-02-04: 50 ug via INTRAVENOUS

## 2016-02-04 MED ORDER — MIDAZOLAM HCL 5 MG/5ML IJ SOLN
INTRAMUSCULAR | Status: DC | PRN
  Administered 2016-02-04: 1 mg via INTRAVENOUS

## 2016-02-04 MED ORDER — PHENYLEPHRINE HCL 10 MG/ML IJ SOLN
10.0000 mg | INTRAVENOUS | Status: DC | PRN
  Administered 2016-02-04: 20 ug/min via INTRAVENOUS

## 2016-02-04 MED ORDER — SODIUM CHLORIDE 0.9 % IV SOLN
INTRAVENOUS | Status: DC | PRN
  Administered 2016-02-04: 15:00:00 via INTRAVENOUS

## 2016-02-04 MED ORDER — FENTANYL CITRATE (PF) 250 MCG/5ML IJ SOLN
INTRAMUSCULAR | Status: AC
  Filled 2016-02-04: qty 5

## 2016-02-04 MED ORDER — MIDAZOLAM HCL 2 MG/2ML IJ SOLN
INTRAMUSCULAR | Status: AC
  Filled 2016-02-04: qty 2

## 2016-02-04 MED ORDER — 0.9 % SODIUM CHLORIDE (POUR BTL) OPTIME
TOPICAL | Status: DC | PRN
  Administered 2016-02-04: 1000 mL

## 2016-02-04 MED ORDER — LIDOCAINE 2% (20 MG/ML) 5 ML SYRINGE
INTRAMUSCULAR | Status: AC
  Filled 2016-02-04: qty 10

## 2016-02-04 MED ORDER — PROPOFOL 10 MG/ML IV BOLUS
INTRAVENOUS | Status: AC
  Filled 2016-02-04: qty 20

## 2016-02-04 MED ORDER — CEFAZOLIN SODIUM-DEXTROSE 2-3 GM-% IV SOLR: INTRAVENOUS | Status: DC | PRN

## 2016-02-04 SURGICAL SUPPLY — 34 items
BLADE SURG 15 STRL LF DISP TIS (BLADE) ×1 IMPLANT
BLADE SURG 15 STRL SS (BLADE) ×2
CLEANER TIP ELECTROSURG 2X2 (MISCELLANEOUS) ×3 IMPLANT
COVER SURGICAL LIGHT HANDLE (MISCELLANEOUS) ×3 IMPLANT
ELECT COATED BLADE 2.86 ST (ELECTRODE) ×3 IMPLANT
ELECT REM PT RETURN 9FT ADLT (ELECTROSURGICAL) ×3
ELECTRODE REM PT RTRN 9FT ADLT (ELECTROSURGICAL) ×1 IMPLANT
GAUZE SPONGE 4X4 16PLY XRAY LF (GAUZE/BANDAGES/DRESSINGS) ×3 IMPLANT
GEL ULTRASOUND 20GR AQUASONIC (MISCELLANEOUS) ×3 IMPLANT
GLOVE SS BIOGEL STRL SZ 7.5 (GLOVE) ×1 IMPLANT
GLOVE SUPERSENSE BIOGEL SZ 7.5 (GLOVE) ×2
GOWN STRL REUS W/ TWL LRG LVL3 (GOWN DISPOSABLE) ×1 IMPLANT
GOWN STRL REUS W/ TWL XL LVL3 (GOWN DISPOSABLE) ×1 IMPLANT
GOWN STRL REUS W/TWL LRG LVL3 (GOWN DISPOSABLE) ×2
GOWN STRL REUS W/TWL XL LVL3 (GOWN DISPOSABLE) ×2
HOLDER TRACH TUBE VELCRO 19.5 (MISCELLANEOUS) ×3 IMPLANT
KIT BASIN OR (CUSTOM PROCEDURE TRAY) ×3 IMPLANT
KIT ROOM TURNOVER OR (KITS) ×3 IMPLANT
KIT SUCTION CATH 14FR (SUCTIONS) ×3 IMPLANT
NEEDLE HYPO 25GX1X1/2 BEV (NEEDLE) ×3 IMPLANT
NS IRRIG 1000ML POUR BTL (IV SOLUTION) ×3 IMPLANT
PACK EENT II TURBAN DRAPE (CUSTOM PROCEDURE TRAY) ×3 IMPLANT
PAD ARMBOARD 7.5X6 YLW CONV (MISCELLANEOUS) ×3 IMPLANT
PENCIL BUTTON HOLSTER BLD 10FT (ELECTRODE) ×3 IMPLANT
SPONGE DRAIN TRACH 4X4 STRL 2S (GAUZE/BANDAGES/DRESSINGS) ×3 IMPLANT
SPONGE INTESTINAL PEANUT (DISPOSABLE) ×3 IMPLANT
SUT SILK 2 0 SH CR/8 (SUTURE) ×3 IMPLANT
SYR 20CC LL (SYRINGE) ×3 IMPLANT
SYR CONTROL 10ML LL (SYRINGE) ×3 IMPLANT
TOWEL OR 17X24 6PK STRL BLUE (TOWEL DISPOSABLE) ×3 IMPLANT
TOWEL OR 17X26 10 PK STRL BLUE (TOWEL DISPOSABLE) ×3 IMPLANT
TUBE CONNECTING 12'X1/4 (SUCTIONS) ×1
TUBE CONNECTING 12X1/4 (SUCTIONS) ×2 IMPLANT
TUBE TRACH 7.0 EXL PROX CUF (TUBING) ×3 IMPLANT

## 2016-02-04 NOTE — Transfer of Care (Signed)
Immediate Anesthesia Transfer of Care Note  Patient: Lawrence PandaGerald E Grieshaber Jr.  Procedure(s) Performed: Procedure(s): TRACHEOSTOMY (N/A)  Patient Location: [facility]  Anesthesia Type:General  Level of Consciousness: lethargic and responds to stimulation  Airway & Oxygen Therapy: Patient remains intubated per anesthesia plan and Patient placed on Ventilator (see vital sign flow sheet for setting)  Post-op Assessment: Report given to RN and Post -op Vital signs reviewed and stable  Post vital signs: Reviewed and stable  Last Vitals:  Filed Vitals:   01/28/16 1617 01/28/16 1628  BP: 138/67   Pulse: 72 67  Temp:    Resp: 20 20    Last Pain: There were no vitals filed for this visit.       Complications: No apparent anesthesia complications

## 2016-02-04 NOTE — Anesthesia Postprocedure Evaluation (Signed)
Anesthesia Post Note  Patient: Lawrence PandaGerald E Chuang Jr.  Procedure(s) Performed: Procedure(s) (LRB): TRACHEOSTOMY (N/A)  Patient location during evaluation: PACU Anesthesia Type: General Level of consciousness: awake and alert Pain management: pain level controlled Vital Signs Assessment: post-procedure vital signs reviewed and stable Respiratory status: spontaneous breathing, nonlabored ventilation, respiratory function stable and patient connected to nasal cannula oxygen Cardiovascular status: blood pressure returned to baseline and stable Postop Assessment: no signs of nausea or vomiting Anesthetic complications: no    Last Vitals:  Filed Vitals:   01/28/16 1617 01/28/16 1628  BP: 138/67   Pulse: 72 67  Temp:    Resp: 20 20    Last Pain: There were no vitals filed for this visit.               Daryan Buell DAVID

## 2016-02-04 NOTE — Anesthesia Preprocedure Evaluation (Addendum)
Anesthesia Evaluation  Patient identified by MRN, date of birth, ID band Patient awake    Reviewed: Allergy & Precautions, NPO status , Patient's Chart, lab work & pertinent test results  Airway Mallampati: Intubated       Dental  (+) Poor Dentition   Pulmonary    Pulmonary exam normal        Cardiovascular + CAD and + Past MI  Normal cardiovascular exam     Neuro/Psych    GI/Hepatic   Endo/Other  diabetes, Type 2, Oral Hypoglycemic Agents, Insulin Dependent  Renal/GU Renal disease     Musculoskeletal   Abdominal   Peds  Hematology  (+) Blood dyscrasia, anemia ,   Anesthesia Other Findings   Reproductive/Obstetrics                            Anesthesia Physical Anesthesia Plan  ASA: III  Anesthesia Plan: General   Post-op Pain Management:    Induction: Inhalational  Airway Management Planned: Tracheostomy and Oral ETT  Additional Equipment:   Intra-op Plan:   Post-operative Plan: Post-operative intubation/ventilation  Informed Consent: I have reviewed the patients History and Physical, chart, labs and discussed the procedure including the risks, benefits and alternatives for the proposed anesthesia with the patient or authorized representative who has indicated his/her understanding and acceptance.   Dental advisory given  Plan Discussed with: Surgeon and CRNA  Anesthesia Plan Comments:        Anesthesia Quick Evaluation

## 2016-02-04 NOTE — Brief Op Note (Signed)
12/17/2015 - 02/04/2016  3:43 PM  PATIENT:  Lawrence PandaGerald E Lancon Jr.  65 y.o. male  PRE-OPERATIVE DIAGNOSIS:  Chronic Respiratory failure  POST-OPERATIVE DIAGNOSIS:  Chronic Respiratory failure  PROCEDURE:  Procedure(s): TRACHEOSTOMY (N/A) 70 proximal XLT cuffed trach  SURGEON:  Surgeon(s) and Role:    * Drema Halonhristopher E Newman, MD - Primary  PHYSICIAN ASSISTANT:   ASSISTANTS: none   ANESTHESIA:   general  EBL:     BLOOD ADMINISTERED:none  DRAINS: none   LOCAL MEDICATIONS USED:  LIDOCAINE with epi 5 cc  SPECIMEN:  No Specimen  DISPOSITION OF SPECIMEN:  N/A  COUNTS:  YES  TOURNIQUET:  * No tourniquets in log *  DICTATION: Reubin Milan.Dragon Dictation dictated [id number]  PLAN OF CARE: Discharge to home after PACU  PATIENT DISPOSITION:  PACU - hemodynamically stable.   Delay start of Pharmacological VTE agent (>24hrs) due to surgical blood loss or risk of bleeding: yes

## 2016-02-04 NOTE — Consult Note (Signed)
Reason for Consult:evaluate for trach Referring Physician: Avinash Maltos. is an 65 y.o. male.  HPI: Admitted to [facility] at the end of May following a right BKA that was complicated by post rehab infection and sepsis. Patient had a prolonged hospital course at [facility] and and was subsequently transferred to [facility] at the end of May. He required intubation on June 26th because of ARF and has been intubated on the vent since that time and has been unable to be extubated. He's taken to the OR for tracheotomy.  No past medical history on file.  Past Surgical History  Procedure Laterality Date  . Amputation Right 01/09/2016    Procedure: Right Above Knee Amputation;  Surgeon: Newt Minion, MD;  Location: [place];  Service: Orthopedics;  Laterality: Right;    Social History:  has no tobacco, alcohol, and drug history on file.  Allergies:  Allergies  Allergen Reactions  . Sulfa Antibiotics Rash    Medications: I have reviewed the patient's current medications.  Results for orders placed or performed during the hospital encounter of 12/17/15 (from the past 48 hour(s))  Renal function panel     Status: Abnormal   Collection Time: 02/03/16  9:08 AM  Result Value Ref Range   Sodium 140 135 - 145 mmol/L   Potassium 3.4 (L) 3.5 - 5.1 mmol/L   Chloride 103 101 - 111 mmol/L   CO2 29 22 - 32 mmol/L   Glucose, Bld 110 (H) 65 - 99 mg/dL   BUN 67 (H) 6 - 20 mg/dL   Creatinine, Ser 1.58 (H) 0.61 - 1.24 mg/dL   Calcium 8.0 (L) 8.9 - 10.3 mg/dL   Phosphorus 2.8 2.5 - 4.6 mg/dL   Albumin 1.4 (L) 3.5 - 5.0 g/dL   GFR calc non Af Amer 44 (L) >60 mL/min   GFR calc Af Amer 51 (L) >60 mL/min    Comment: (NOTE) The eGFR has been calculated using the CKD EPI equation. This calculation has not been validated in all clinical situations. eGFR's persistently <60 mL/min signify possible Chronic Kidney Disease.    Anion gap 8 5 - 15  CBC with Differential/Platelet     Status: Abnormal   Collection Time: 02/03/16  9:09 AM  Result Value Ref Range   WBC 13.4 (H) 4.0 - 10.5 K/uL   RBC 2.64 (L) 4.22 - 5.81 MIL/uL   Hemoglobin 8.2 (L) 13.0 - 17.0 g/dL   HCT 25.1 (L) 39.0 - 52.0 %   MCV 95.1 78.0 - 100.0 fL   MCH 31.1 26.0 - 34.0 pg   MCHC 32.7 30.0 - 36.0 g/dL   RDW 18.2 (H) 11.5 - 15.5 %   Platelets 307 150 - 400 K/uL   Neutrophils Relative % 85 %   Lymphocytes Relative 8 %   Monocytes Relative 6 %   Eosinophils Relative 1 %   Basophils Relative 0 %   Neutro Abs 11.4 (H) 1.7 - 7.7 K/uL   Lymphs Abs 1.1 0.7 - 4.0 K/uL   Monocytes Absolute 0.8 0.1 - 1.0 K/uL   Eosinophils Absolute 0.1 0.0 - 0.7 K/uL   Basophils Absolute 0.0 0.0 - 0.1 K/uL   Smear Review MORPHOLOGY UNREMARKABLE   Magnesium     Status: None   Collection Time: 02/03/16  9:09 AM  Result Value Ref Range   Magnesium 2.0 1.7 - 2.4 mg/dL  CBC with Differential/Platelet     Status: Abnormal   Collection Time: 02/04/16  5:09 AM  Result Value Ref Range   WBC 9.9 4.0 - 10.5 K/uL   RBC 2.75 (L) 4.22 - 5.81 MIL/uL   Hemoglobin 8.6 (L) 13.0 - 17.0 g/dL   HCT 26.3 (L) 39.0 - 52.0 %   MCV 95.6 78.0 - 100.0 fL   MCH 31.3 26.0 - 34.0 pg   MCHC 32.7 30.0 - 36.0 g/dL   RDW 18.2 (H) 11.5 - 15.5 %   Platelets 351 150 - 400 K/uL   Neutrophils Relative % 68 %   Neutro Abs 6.7 1.7 - 7.7 K/uL   Lymphocytes Relative 18 %   Lymphs Abs 1.8 0.7 - 4.0 K/uL   Monocytes Relative 11 %   Monocytes Absolute 1.1 (H) 0.1 - 1.0 K/uL   Eosinophils Relative 3 %   Eosinophils Absolute 0.3 0.0 - 0.7 K/uL   Basophils Relative 0 %   Basophils Absolute 0.0 0.0 - 0.1 K/uL  Magnesium     Status: None   Collection Time: 02/04/16  5:09 AM  Result Value Ref Range   Magnesium 1.9 1.7 - 2.4 mg/dL  Renal function panel     Status: Abnormal   Collection Time: 02/04/16  5:09 AM  Result Value Ref Range   Sodium 141 135 - 145 mmol/L   Potassium 3.6 3.5 - 5.1 mmol/L   Chloride 102 101 - 111 mmol/L   CO2 30 22 - 32 mmol/L   Glucose, Bld 179  (H) 65 - 99 mg/dL   BUN 64 (H) 6 - 20 mg/dL   Creatinine, Ser 1.46 (H) 0.61 - 1.24 mg/dL   Calcium 8.1 (L) 8.9 - 10.3 mg/dL   Phosphorus 2.8 2.5 - 4.6 mg/dL   Albumin 1.3 (L) 3.5 - 5.0 g/dL   GFR calc non Af Amer 48 (L) >60 mL/min   GFR calc Af Amer 56 (L) >60 mL/min    Comment: (NOTE) The eGFR has been calculated using the CKD EPI equation. This calculation has not been validated in all clinical situations. eGFR's persistently <60 mL/min signify possible Chronic Kidney Disease.    Anion gap 9 5 - 15    No results found.  JYN:[id number] not responsive   HY:QMVHQ male intubated on vent and non responsive Obese neck. No neck mass. Trach midline with low cricoid  Assessment/Plan: Chronic ventilatory dependent respiratory failure  Plan elective trach  Juliza Machnik 02/04/2016, 1:58 PM

## 2016-02-04 NOTE — OR Nursing (Signed)
Pt had a loose stool. Skin care and foley care provided. Applied new dressing to sacral wound and right AKA. Sacrum excoriated with open area. Two small ulcers on penis noted. Urine yellow, cloudy with sediment. Pus noted at PICC line site.

## 2016-02-05 ENCOUNTER — Encounter (HOSPITAL_COMMUNITY): Payer: Self-pay | Admitting: Otolaryngology

## 2016-02-05 LAB — CBC WITH DIFFERENTIAL/PLATELET
Basophils Absolute: 0 10*3/uL (ref 0.0–0.1)
Basophils Relative: 0 %
EOS ABS: 0.2 10*3/uL (ref 0.0–0.7)
EOS PCT: 1 %
HEMATOCRIT: 27.4 % — AB (ref 39.0–52.0)
Hemoglobin: 8.6 g/dL — ABNORMAL LOW (ref 13.0–17.0)
LYMPHS ABS: 1.4 10*3/uL (ref 0.7–4.0)
LYMPHS PCT: 9 %
MCH: 30 pg (ref 26.0–34.0)
MCHC: 31.4 g/dL (ref 30.0–36.0)
MCV: 95.5 fL (ref 78.0–100.0)
MONO ABS: 1 10*3/uL (ref 0.1–1.0)
Monocytes Relative: 6 %
Neutro Abs: 12.8 10*3/uL — ABNORMAL HIGH (ref 1.7–7.7)
Neutrophils Relative %: 84 %
Platelets: 357 10*3/uL (ref 150–400)
RBC: 2.87 MIL/uL — ABNORMAL LOW (ref 4.22–5.81)
RDW: 18.4 % — ABNORMAL HIGH (ref 11.5–15.5)
WBC: 15.3 10*3/uL — ABNORMAL HIGH (ref 4.0–10.5)

## 2016-02-05 LAB — RENAL FUNCTION PANEL
ALBUMIN: 1.4 g/dL — AB (ref 3.5–5.0)
ANION GAP: 10 (ref 5–15)
BUN: 64 mg/dL — ABNORMAL HIGH (ref 6–20)
CALCIUM: 8 mg/dL — AB (ref 8.9–10.3)
CO2: 30 mmol/L (ref 22–32)
Chloride: 102 mmol/L (ref 101–111)
Creatinine, Ser: 1.47 mg/dL — ABNORMAL HIGH (ref 0.61–1.24)
GFR, EST AFRICAN AMERICAN: 55 mL/min — AB (ref >60)
GFR, EST NON AFRICAN AMERICAN: 48 mL/min — AB (ref >60)
Glucose, Bld: 88 mg/dL (ref 65–99)
PHOSPHORUS: 2.8 mg/dL (ref 2.5–4.6)
POTASSIUM: 3.2 mmol/L — AB (ref 3.5–5.1)
Sodium: 142 mmol/L (ref 135–145)

## 2016-02-05 LAB — MAGNESIUM: MAGNESIUM: 1.9 mg/dL (ref 1.7–2.4)

## 2016-02-05 NOTE — Op Note (Signed)
NAMCheral Stephenson:  Birky, [place]              ACCOUNT NO.:  [id number]  MEDICAL RECORD NO.:  [id number]  LOCATION:                                 FACILITY:  PHYSICIAN:  Kristine GarbeChristopher E. Ezzard StandingNewman, M.D.DATE OF BIRTH:  04/17/1951  DATE OF PROCEDURE:  02/04/2016 DATE OF DISCHARGE:                              OPERATIVE REPORT   PREOPERATIVE DIAGNOSIS:  Chronic ventilation-dependent respiratory failure.  POSTOPERATIVE DIAGNOSIS:  Chronic ventilation-dependent respiratory failure.  OPERATION PERFORMED:  Tracheostomy with a 70 proximal XLT cuffed trach.  SURGEON:  Kristine GarbeChristopher E. Ezzard StandingNewman, M.D.  ANESTHESIA:  General endotracheal.  COMPLICATIONS:  None.  BRIEF CLINICAL NOTE:  Lawrence MarkerGerald Rud is a 65 year old gentleman who is status post right AKA approximately 10 weeks ago, developed subsequent infection, osteomyelitis with secondary sepsis and was [facility].  The patient was subsequently transferred to [facility] at the end of May for chronic long-term care of the wounds and infection.  The patient went to respiratory failure and required intubation on January 19, 2016, and has been on the ventilator since that time.  They had been unable to extubate her.  She has been in ventilator-dependent respiratory failure for the last 2-1/2 weeks.  It was elected to pursue tracheostomy on her and I was consulted concerning possible tracheotomy.  The patient was intubated and was really nonresponsive.  DESCRIPTION OF PROCEDURE:  He was taken to the operating room at this time for tracheotomy.  The patient is presently intubated with a #8 endotracheal tube.  The patient was brought straight from [facility] down to the OR.  The patient remained in his bed.  Shoulder roll was placed underneath his shoulders to extend his neck.  The neck was then prepped with Betadine solution and draped out with sterile towels.  The proposed incision site was marked out  and injected with 4 to 5 mL of Xylocaine with epinephrine for hemostasis.  There were no palpable masses.  Janina Mayorach was midline, but he had a fairly low cricoid.  A vertical incision was made just above the suprasternal notch. Dissection was carried down through the subcutaneous tissue with cautery.  Strap muscles were identified and retracted laterally.  The cricoid cartilage was identified and the thyroid isthmus was divided just below the cricoid cartilage to expose the first 2 tracheal rings. A horizontal incision was made between the first and second tracheal rings below the cricoid cartilage.  The endotracheal tube was removed and a 70 proximal XLT trach was positioned without any difficulty.  There was minimal bleeding. After placement of trach, the patient was ventilated well and the trach was secured with 2-0 silk sutures x4 and a Velcro trach tape around the neck.  The [facility] was subsequently [facility].    ______________________________ Kristine Garbehristopher E. Ezzard StandingNewman, M.D.   ______________________________ Kristine Garbehristopher E. Ezzard StandingNewman, M.D.    CEN/MEDQ  D:  02/04/2016  T:  02/05/2016  Job:  [id number]

## 2016-02-06 ENCOUNTER — Other Ambulatory Visit (HOSPITAL_COMMUNITY): Payer: Self-pay

## 2016-02-06 LAB — RENAL FUNCTION PANEL
ANION GAP: 12 (ref 5–15)
Albumin: 1.3 g/dL — ABNORMAL LOW (ref 3.5–5.0)
BUN: 63 mg/dL — ABNORMAL HIGH (ref 6–20)
CHLORIDE: 99 mmol/L — AB (ref 101–111)
CO2: 31 mmol/L (ref 22–32)
Calcium: 8 mg/dL — ABNORMAL LOW (ref 8.9–10.3)
Creatinine, Ser: 1.43 mg/dL — ABNORMAL HIGH (ref 0.61–1.24)
GFR calc Af Amer: 57 mL/min — ABNORMAL LOW (ref >60)
GFR calc non Af Amer: 49 mL/min — ABNORMAL LOW (ref >60)
GLUCOSE: 159 mg/dL — AB (ref 65–99)
POTASSIUM: 3.5 mmol/L (ref 3.5–5.1)
Phosphorus: 2.8 mg/dL (ref 2.5–4.6)
Sodium: 142 mmol/L (ref 135–145)

## 2016-02-06 LAB — CBC WITH DIFFERENTIAL/PLATELET
BASOS PCT: 1 %
Basophils Absolute: 0.1 10*3/uL (ref 0.0–0.1)
EOS ABS: 0.3 10*3/uL (ref 0.0–0.7)
EOS PCT: 3 %
HEMATOCRIT: 24.7 % — AB (ref 39.0–52.0)
HEMOGLOBIN: 7.9 g/dL — AB (ref 13.0–17.0)
LYMPHS PCT: 23 %
Lymphs Abs: 2.6 10*3/uL (ref 0.7–4.0)
MCH: 30.7 pg (ref 26.0–34.0)
MCHC: 32 g/dL (ref 30.0–36.0)
MCV: 96.1 fL (ref 78.0–100.0)
MONOS PCT: 10 %
Monocytes Absolute: 1.1 10*3/uL — ABNORMAL HIGH (ref 0.1–1.0)
NEUTROS ABS: 7 10*3/uL (ref 1.7–7.7)
Neutrophils Relative %: 63 %
Platelets: 347 10*3/uL (ref 150–400)
RBC: 2.57 MIL/uL — ABNORMAL LOW (ref 4.22–5.81)
RDW: 18.4 % — ABNORMAL HIGH (ref 11.5–15.5)
WBC: 11.1 10*3/uL — ABNORMAL HIGH (ref 4.0–10.5)

## 2016-02-06 LAB — MAGNESIUM: MAGNESIUM: 2.3 mg/dL (ref 1.7–2.4)

## 2016-02-07 ENCOUNTER — Other Ambulatory Visit (HOSPITAL_COMMUNITY): Payer: Self-pay

## 2016-02-07 LAB — CBC
HCT: 25 % — ABNORMAL LOW (ref 39.0–52.0)
Hemoglobin: 7.9 g/dL — ABNORMAL LOW (ref 13.0–17.0)
MCH: 30.2 pg (ref 26.0–34.0)
MCHC: 31.6 g/dL (ref 30.0–36.0)
MCV: 95.4 fL (ref 78.0–100.0)
PLATELETS: 307 10*3/uL (ref 150–400)
RBC: 2.62 MIL/uL — ABNORMAL LOW (ref 4.22–5.81)
RDW: 18 % — AB (ref 11.5–15.5)
WBC: 12.9 10*3/uL — ABNORMAL HIGH (ref 4.0–10.5)

## 2016-02-07 LAB — URINALYSIS, ROUTINE W REFLEX MICROSCOPIC
Bilirubin Urine: NEGATIVE
Glucose, UA: NEGATIVE mg/dL
Ketones, ur: NEGATIVE mg/dL
Nitrite: NEGATIVE
Protein, ur: 30 mg/dL — AB
SPECIFIC GRAVITY, URINE: 1.01 (ref 1.005–1.030)
pH: 8 (ref 5.0–8.0)

## 2016-02-07 LAB — URINE MICROSCOPIC-ADD ON

## 2016-02-08 LAB — RENAL FUNCTION PANEL
ALBUMIN: 1.3 g/dL — AB (ref 3.5–5.0)
Anion gap: 10 (ref 5–15)
BUN: 66 mg/dL — ABNORMAL HIGH (ref 6–20)
CALCIUM: 7.7 mg/dL — AB (ref 8.9–10.3)
CO2: 31 mmol/L (ref 22–32)
CREATININE: 1.52 mg/dL — AB (ref 0.61–1.24)
Chloride: 103 mmol/L (ref 101–111)
GFR calc non Af Amer: 46 mL/min — ABNORMAL LOW (ref >60)
GFR, EST AFRICAN AMERICAN: 53 mL/min — AB (ref >60)
Glucose, Bld: 170 mg/dL — ABNORMAL HIGH (ref 65–99)
PHOSPHORUS: 1.6 mg/dL — AB (ref 2.5–4.6)
Potassium: 2.9 mmol/L — ABNORMAL LOW (ref 3.5–5.1)
SODIUM: 144 mmol/L (ref 135–145)

## 2016-02-08 LAB — CBC WITH DIFFERENTIAL/PLATELET
BASOS ABS: 0.1 10*3/uL (ref 0.0–0.1)
BASOS PCT: 0 %
EOS ABS: 0.2 10*3/uL (ref 0.0–0.7)
Eosinophils Relative: 2 %
HEMATOCRIT: 24.4 % — AB (ref 39.0–52.0)
HEMOGLOBIN: 7.4 g/dL — AB (ref 13.0–17.0)
Lymphocytes Relative: 21 %
Lymphs Abs: 2.3 10*3/uL (ref 0.7–4.0)
MCH: 29.5 pg (ref 26.0–34.0)
MCHC: 30.3 g/dL (ref 30.0–36.0)
MCV: 97.2 fL (ref 78.0–100.0)
Monocytes Absolute: 1 10*3/uL (ref 0.1–1.0)
Monocytes Relative: 9 %
NEUTROS ABS: 7.9 10*3/uL — AB (ref 1.7–7.7)
NEUTROS PCT: 68 %
Platelets: 331 10*3/uL (ref 150–400)
RBC: 2.51 MIL/uL — AB (ref 4.22–5.81)
RDW: 17.9 % — ABNORMAL HIGH (ref 11.5–15.5)
WBC: 11.4 10*3/uL — AB (ref 4.0–10.5)

## 2016-02-08 LAB — BLOOD CULTURE ID PANEL (REFLEXED)
Acinetobacter baumannii: NOT DETECTED
CANDIDA GLABRATA: NOT DETECTED
CANDIDA KRUSEI: NOT DETECTED
CANDIDA TROPICALIS: NOT DETECTED
CARBAPENEM RESISTANCE: NOT DETECTED
Candida albicans: NOT DETECTED
Candida parapsilosis: NOT DETECTED
ESCHERICHIA COLI: NOT DETECTED
Enterobacter cloacae complex: NOT DETECTED
Enterobacteriaceae species: NOT DETECTED
Enterococcus species: NOT DETECTED
HAEMOPHILUS INFLUENZAE: NOT DETECTED
KLEBSIELLA OXYTOCA: NOT DETECTED
Klebsiella pneumoniae: NOT DETECTED
LISTERIA MONOCYTOGENES: NOT DETECTED
Methicillin resistance: NOT DETECTED
Neisseria meningitidis: NOT DETECTED
PROTEUS SPECIES: NOT DETECTED
Pseudomonas aeruginosa: NOT DETECTED
SERRATIA MARCESCENS: NOT DETECTED
STAPHYLOCOCCUS SPECIES: NOT DETECTED
STREPTOCOCCUS PYOGENES: NOT DETECTED
Staphylococcus aureus (BCID): NOT DETECTED
Streptococcus agalactiae: NOT DETECTED
Streptococcus pneumoniae: NOT DETECTED
Streptococcus species: NOT DETECTED
Vancomycin resistance: NOT DETECTED

## 2016-02-08 LAB — URINE CULTURE: CULTURE: NO GROWTH

## 2016-02-08 LAB — BRAIN NATRIURETIC PEPTIDE: B NATRIURETIC PEPTIDE 5: 414.8 pg/mL — AB (ref 0.0–100.0)

## 2016-02-08 LAB — MAGNESIUM: MAGNESIUM: 1.9 mg/dL (ref 1.7–2.4)

## 2016-02-09 ENCOUNTER — Other Ambulatory Visit (HOSPITAL_COMMUNITY): Payer: Self-pay

## 2016-02-09 LAB — CBC WITH DIFFERENTIAL/PLATELET
BASOS PCT: 0 %
BASOS PCT: 1 %
Basophils Absolute: 0 10*3/uL (ref 0.0–0.1)
Basophils Absolute: 0.1 10*3/uL (ref 0.0–0.1)
Eosinophils Absolute: 0.3 10*3/uL (ref 0.0–0.7)
Eosinophils Absolute: 0.5 10*3/uL (ref 0.0–0.7)
Eosinophils Relative: 2 %
Eosinophils Relative: 5 %
HEMATOCRIT: 24.7 % — AB (ref 39.0–52.0)
HEMATOCRIT: 23 % — AB (ref 39.0–52.0)
HEMOGLOBIN: 7.5 g/dL — AB (ref 13.0–17.0)
HEMOGLOBIN: 7.1 g/dL — AB (ref 13.0–17.0)
LYMPHS ABS: 1.6 10*3/uL (ref 0.7–4.0)
Lymphocytes Relative: 12 %
Lymphocytes Relative: 23 %
Lymphs Abs: 2.4 10*3/uL (ref 0.7–4.0)
MCH: 29.4 pg (ref 26.0–34.0)
MCH: 30 pg (ref 26.0–34.0)
MCHC: 30.4 g/dL (ref 30.0–36.0)
MCHC: 30.9 g/dL (ref 30.0–36.0)
MCV: 96.9 fL (ref 78.0–100.0)
MCV: 97 fL (ref 78.0–100.0)
MONOS PCT: 7 %
MONOS PCT: 9 %
Monocytes Absolute: 1 10*3/uL (ref 0.1–1.0)
Monocytes Absolute: 1 10*3/uL (ref 0.1–1.0)
NEUTROS ABS: 11 10*3/uL — AB (ref 1.7–7.7)
NEUTROS ABS: 6.5 10*3/uL (ref 1.7–7.7)
NEUTROS PCT: 79 %
NEUTROS PCT: 62 %
Platelets: 327 10*3/uL (ref 150–400)
Platelets: 292 10*3/uL (ref 150–400)
RBC: 2.55 MIL/uL — AB (ref 4.22–5.81)
RBC: 2.37 MIL/uL — AB (ref 4.22–5.81)
RDW: 18.1 % — ABNORMAL HIGH (ref 11.5–15.5)
RDW: 18.2 % — ABNORMAL HIGH (ref 11.5–15.5)
WBC: 14 10*3/uL — ABNORMAL HIGH (ref 4.0–10.5)
WBC: 10.4 10*3/uL (ref 4.0–10.5)

## 2016-02-09 LAB — COMPREHENSIVE METABOLIC PANEL
ALBUMIN: 1.3 g/dL — AB (ref 3.5–5.0)
ALK PHOS: 330 U/L — AB (ref 38–126)
ALT: 57 U/L (ref 17–63)
AST: 96 U/L — AB (ref 15–41)
Anion gap: 7 (ref 5–15)
BILIRUBIN TOTAL: 0.5 mg/dL (ref 0.3–1.2)
BUN: 68 mg/dL — AB (ref 6–20)
CALCIUM: 7.7 mg/dL — AB (ref 8.9–10.3)
CO2: 33 mmol/L — AB (ref 22–32)
CREATININE: 1.53 mg/dL — AB (ref 0.61–1.24)
Chloride: 105 mmol/L (ref 101–111)
GFR calc Af Amer: 53 mL/min — ABNORMAL LOW (ref >60)
GFR calc non Af Amer: 45 mL/min — ABNORMAL LOW (ref >60)
GLUCOSE: 133 mg/dL — AB (ref 65–99)
Potassium: 3.1 mmol/L — ABNORMAL LOW (ref 3.5–5.1)
Sodium: 145 mmol/L (ref 135–145)
TOTAL PROTEIN: 4.6 g/dL — AB (ref 6.5–8.1)

## 2016-02-09 LAB — URINE MICROSCOPIC-ADD ON: Squamous Epithelial / LPF: NONE SEEN

## 2016-02-09 LAB — RENAL FUNCTION PANEL
ALBUMIN: 1.3 g/dL — AB (ref 3.5–5.0)
ANION GAP: 8 (ref 5–15)
BUN: 69 mg/dL — ABNORMAL HIGH (ref 6–20)
CALCIUM: 7.6 mg/dL — AB (ref 8.9–10.3)
CO2: 33 mmol/L — AB (ref 22–32)
Chloride: 104 mmol/L (ref 101–111)
Creatinine, Ser: 1.5 mg/dL — ABNORMAL HIGH (ref 0.61–1.24)
GFR, EST AFRICAN AMERICAN: 54 mL/min — AB (ref >60)
GFR, EST NON AFRICAN AMERICAN: 46 mL/min — AB (ref >60)
Glucose, Bld: 121 mg/dL — ABNORMAL HIGH (ref 65–99)
PHOSPHORUS: 2 mg/dL — AB (ref 2.5–4.6)
Potassium: 3.7 mmol/L (ref 3.5–5.1)
SODIUM: 145 mmol/L (ref 135–145)

## 2016-02-09 LAB — MAGNESIUM: Magnesium: 1.8 mg/dL (ref 1.7–2.4)

## 2016-02-09 LAB — URINALYSIS, ROUTINE W REFLEX MICROSCOPIC
BILIRUBIN URINE: NEGATIVE
Glucose, UA: NEGATIVE mg/dL
KETONES UR: NEGATIVE mg/dL
Nitrite: NEGATIVE
PH: 8 (ref 5.0–8.0)
PROTEIN: 100 mg/dL — AB
SPECIFIC GRAVITY, URINE: 1.015 (ref 1.005–1.030)

## 2016-02-09 LAB — SEDIMENTATION RATE: Sed Rate: 95 mm/hr — ABNORMAL HIGH (ref 0–16)

## 2016-02-09 LAB — APTT: aPTT: 27 seconds (ref 24–37)

## 2016-02-10 LAB — CULTURE, BLOOD (ROUTINE X 2)

## 2016-02-10 LAB — PREPARE RBC (CROSSMATCH)

## 2016-02-11 ENCOUNTER — Ambulatory Visit (HOSPITAL_COMMUNITY)
Admission: AD | Admit: 2016-02-11 | Discharge: 2016-02-11 | Disposition: A | Discharge status: Home / Self Care | Payer: Medicare Other | Source: Other Acute Inpatient Hospital | Attending: Internal Medicine | Admitting: Internal Medicine

## 2016-02-11 DIAGNOSIS — J9690 Respiratory failure, unspecified, unspecified whether with hypoxia or hypercapnia: Secondary | ICD-10-CM | POA: Diagnosis present

## 2016-02-11 LAB — CBC WITH DIFFERENTIAL/PLATELET
BASOS ABS: 0.1 10*3/uL (ref 0.0–0.1)
BASOS PCT: 0 %
Eosinophils Absolute: 0.8 10*3/uL — ABNORMAL HIGH (ref 0.0–0.7)
Eosinophils Relative: 8 %
HEMATOCRIT: 27.1 % — AB (ref 39.0–52.0)
Hemoglobin: 8.5 g/dL — ABNORMAL LOW (ref 13.0–17.0)
Lymphocytes Relative: 22 %
Lymphs Abs: 2.4 10*3/uL (ref 0.7–4.0)
MCH: 29.3 pg (ref 26.0–34.0)
MCHC: 31.4 g/dL (ref 30.0–36.0)
MCV: 93.4 fL (ref 78.0–100.0)
MONO ABS: 1.1 10*3/uL — AB (ref 0.1–1.0)
Monocytes Relative: 10 %
NEUTROS ABS: 6.8 10*3/uL (ref 1.7–7.7)
Neutrophils Relative %: 60 %
PLATELETS: 336 10*3/uL (ref 150–400)
RBC: 2.9 MIL/uL — AB (ref 4.22–5.81)
RDW: 18.5 % — AB (ref 11.5–15.5)
WBC: 11.2 10*3/uL — AB (ref 4.0–10.5)

## 2016-02-11 LAB — BASIC METABOLIC PANEL
ANION GAP: 7 (ref 5–15)
BUN: 73 mg/dL — ABNORMAL HIGH (ref 6–20)
CALCIUM: 8 mg/dL — AB (ref 8.9–10.3)
CO2: 34 mmol/L — AB (ref 22–32)
Chloride: 102 mmol/L (ref 101–111)
Creatinine, Ser: 1.5 mg/dL — ABNORMAL HIGH (ref 0.61–1.24)
GFR, EST AFRICAN AMERICAN: 54 mL/min — AB (ref >60)
GFR, EST NON AFRICAN AMERICAN: 46 mL/min — AB (ref >60)
Glucose, Bld: 156 mg/dL — ABNORMAL HIGH (ref 65–99)
POTASSIUM: 2.8 mmol/L — AB (ref 3.5–5.1)
Sodium: 143 mmol/L (ref 135–145)

## 2016-02-11 LAB — TYPE AND SCREEN
ABO/RH(D): AB POS
Antibody Screen: NEGATIVE
Unit division: 0

## 2016-02-11 LAB — CULTURE, RESPIRATORY

## 2016-02-11 LAB — CULTURE, RESPIRATORY W GRAM STAIN

## 2016-02-12 LAB — CULTURE, BLOOD (ROUTINE X 2)

## 2016-02-12 LAB — CULTURE, RESPIRATORY

## 2016-02-12 LAB — CULTURE, RESPIRATORY W GRAM STAIN

## 2016-02-13 LAB — CULTURE, BLOOD (ROUTINE X 2)

## 2016-02-20 LAB — SUSCEPTIBILITY RESULT

## 2016-02-20 LAB — SUSCEPTIBILITY, AER + ANAEROB

## 2016-02-24 LAB — CULTURE, BLOOD (ROUTINE X 2)

## 2016-03-26 DEATH — deceased

## 2017-07-19 IMAGING — CR DG CHEST 1V PORT
2 series · 2 of 2 positions shown · non-contrast
Comparison: 01/18/2016

CLINICAL DATA: Respiratory failure

EXAM:
PORTABLE CHEST 1 VIEW

[AP (1 of 2)]
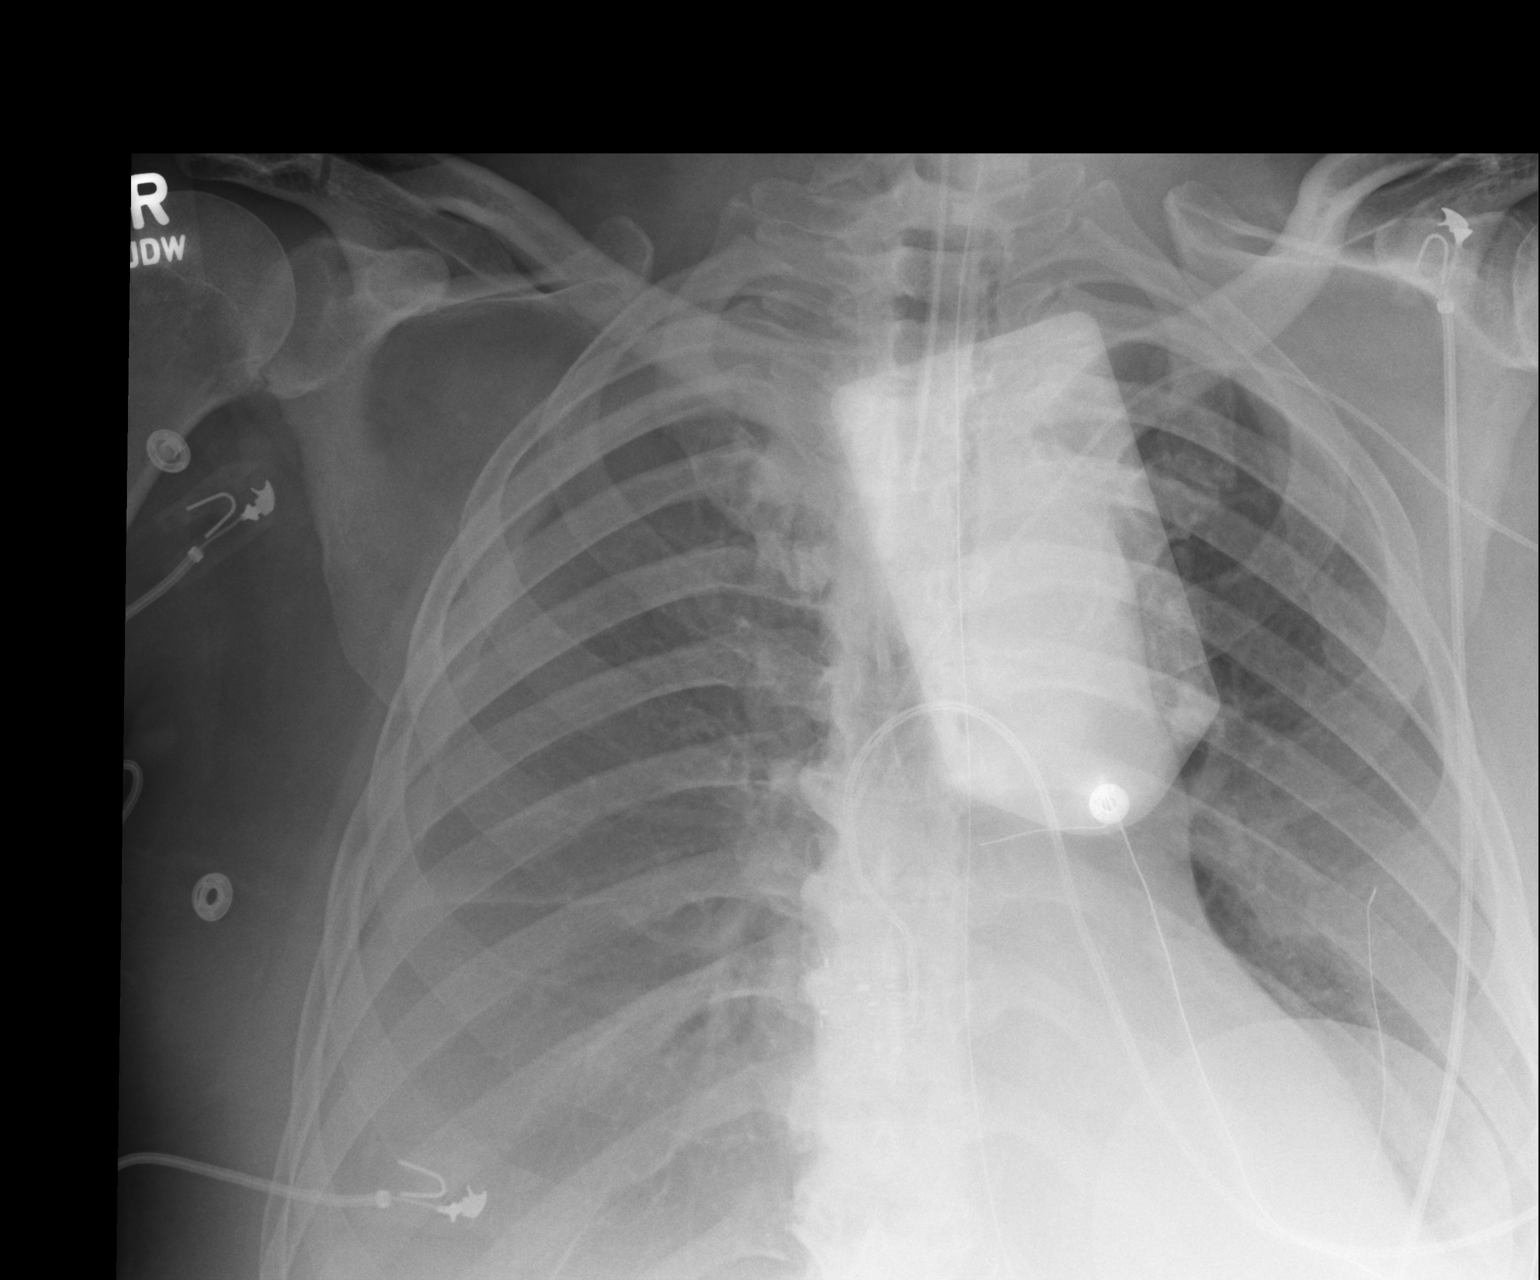

[AP (2 of 2)]
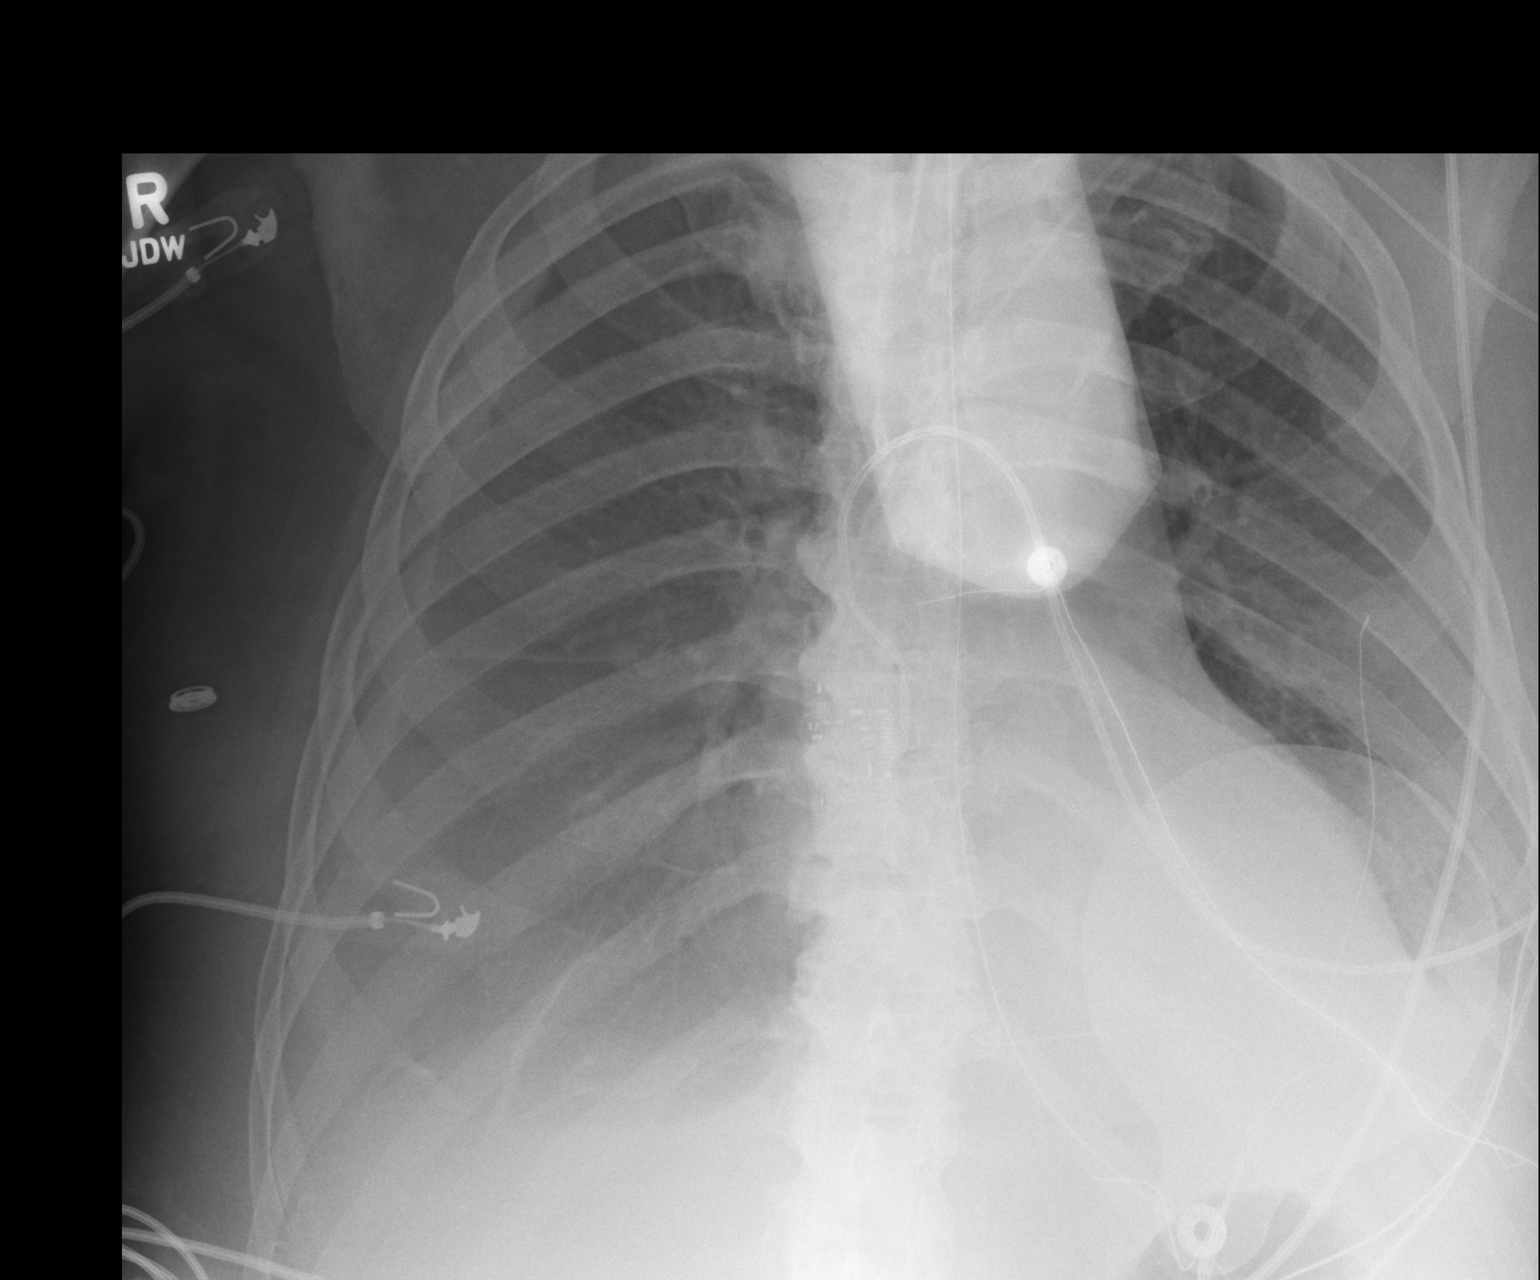

[2 of 2 positions shown; findings below may reference images not displayed]

FINDINGS: Endotracheal tube tip is just below the clavicular heads, 7 cm above
the carina. The nasogastric tube extends below the diaphragm and
beyond the inferior edge of the image. The left upper extremity PICC
line extends into the low SVC. There is a percutaneous pacing patch
superimposing the upper left hemi thorax.

Right pleural effusion persists.

No pneumothorax is evident.
IMPRESSION: 1.  Support equipment appears satisfactorily positioned.
2. Unchanged right pleural effusion.  No pneumothorax.

## 2017-07-19 IMAGING — CT CT ABDOMEN W/O CM
2 of 4 series · 16 of 46 positions shown, 18 images · non-contrast
Comparison: Radiograph January 18, 2016.

CLINICAL DATA: Dysphagia, evaluate anatomy for gastrostomy tube
placement.

EXAM:
CT ABDOMEN WITHOUT CONTRAST
TECHNIQUE: Multidetector CT imaging of the abdomen was performed following the
standard protocol without IV contrast.

[Series 2: a/p w/o 5mm · axial · non-contrast · 0.91mm/px · z∈[-1132,-842]mm · 13 of 68 slices shown, 15 images]
[im 5/68  soft-tissue]
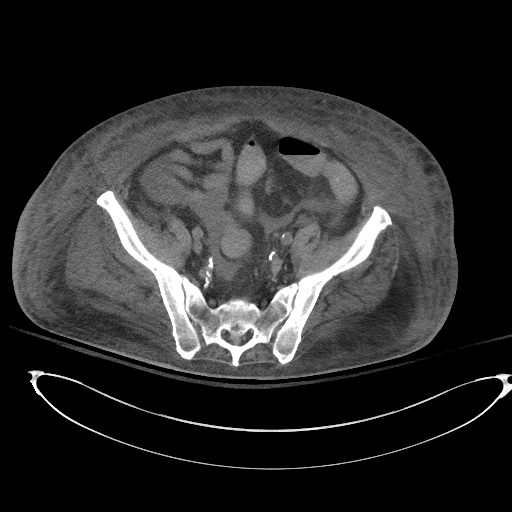
[im 5/68  bone]
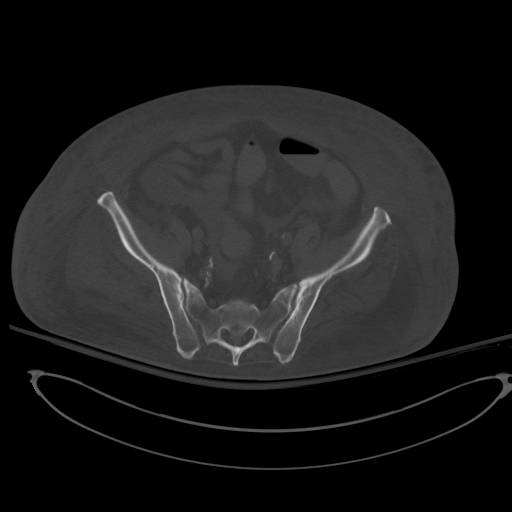
[im 9/68  soft-tissue]
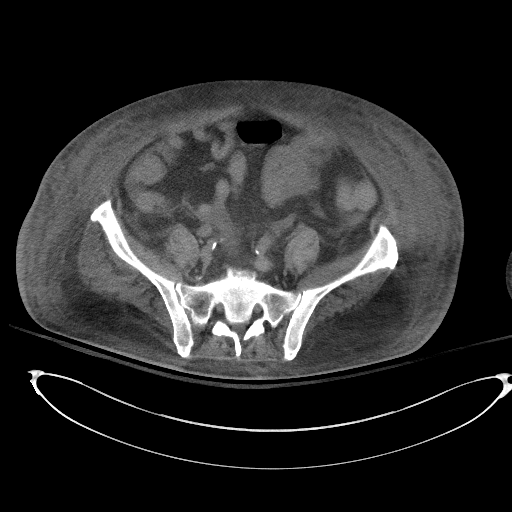
[im 14/68  soft-tissue]
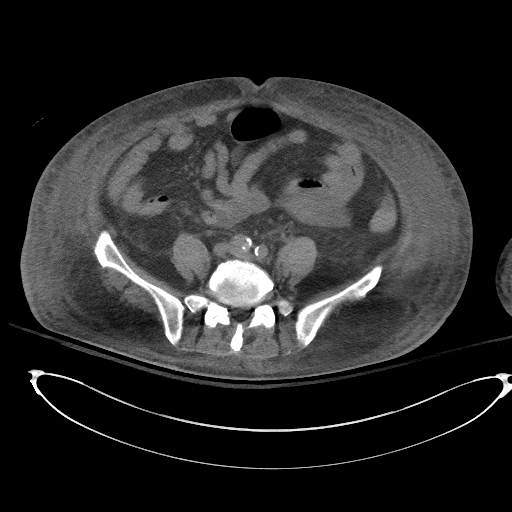
[im 18/68  soft-tissue]
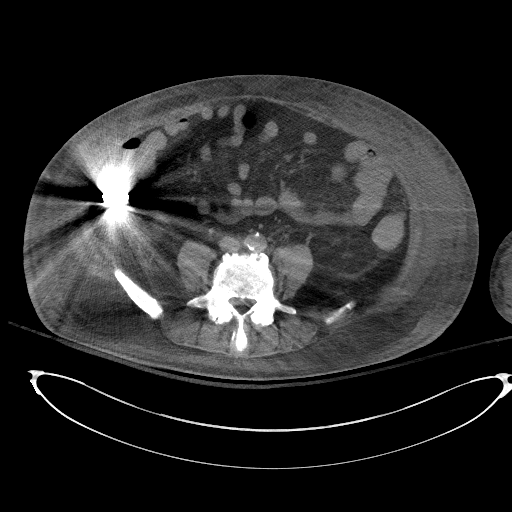
[im 25/68  soft-tissue]
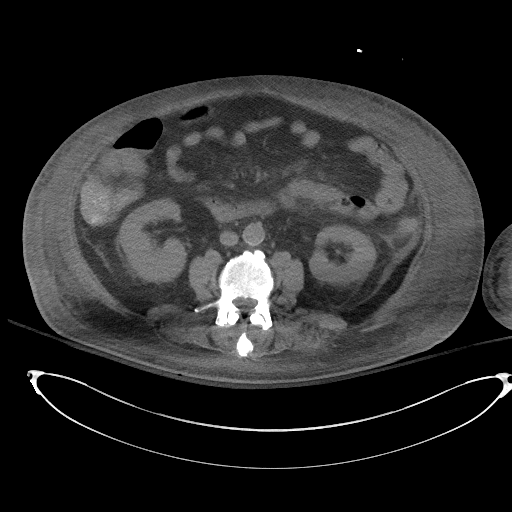
[im 30/68  soft-tissue]
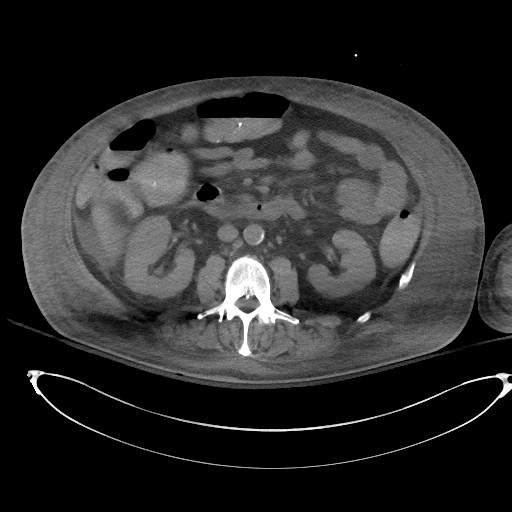
[im 34/68  soft-tissue]
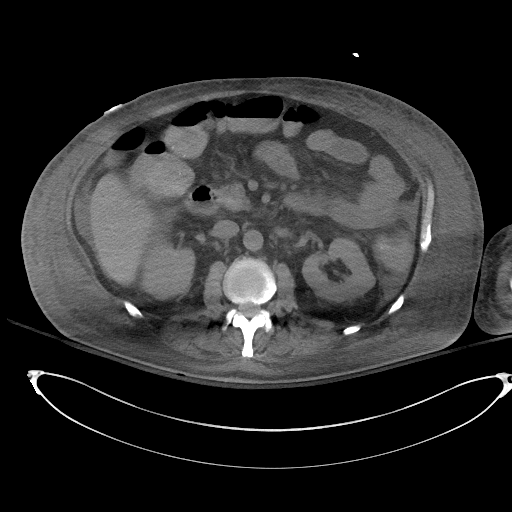
[im 38/68  soft-tissue]
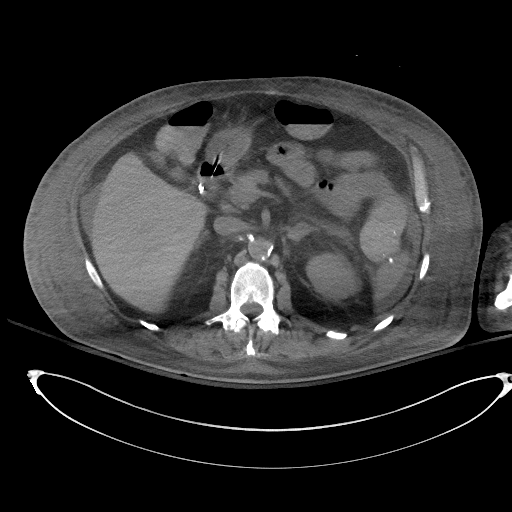
[im 43/68  soft-tissue]
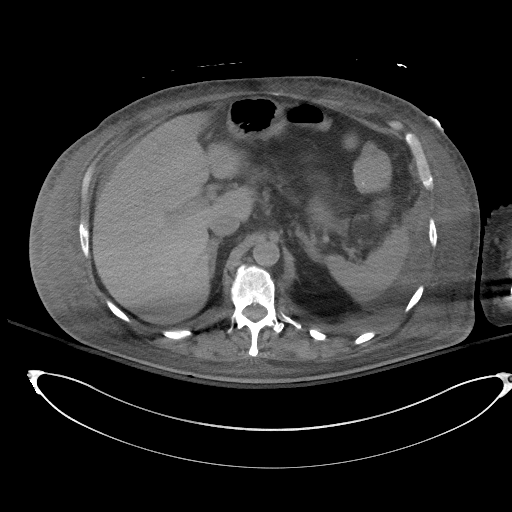
[im 43/68  bone]
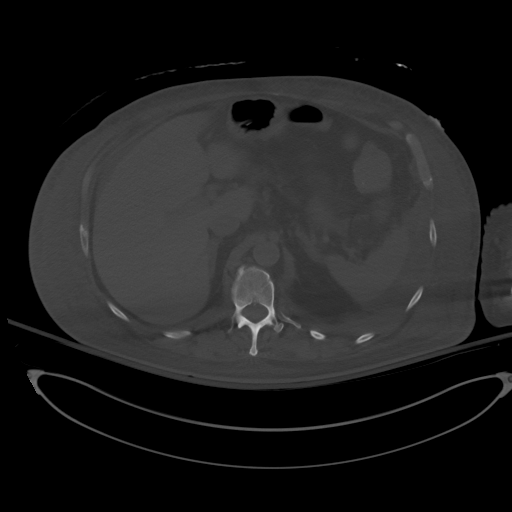
[im 50/68  soft-tissue]
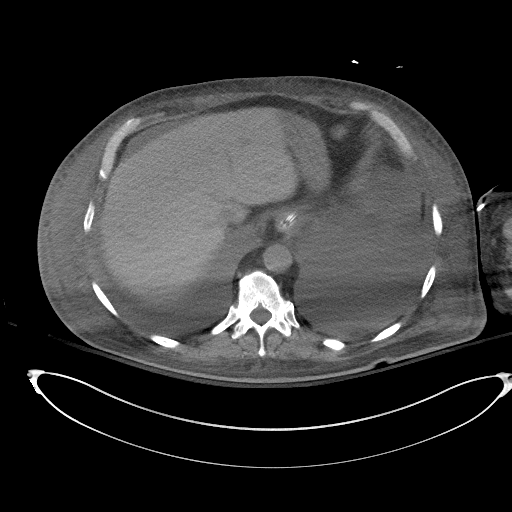
[im 54/68  soft-tissue]
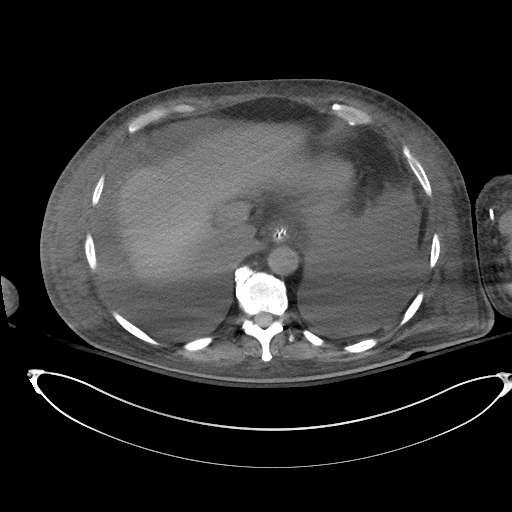
[im 59/68  soft-tissue]
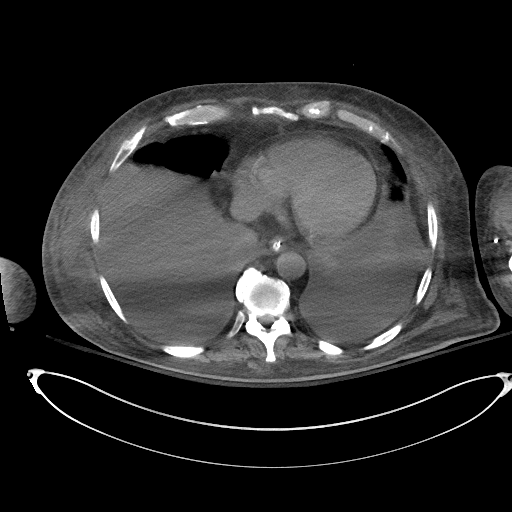
[im 63/68  soft-tissue]
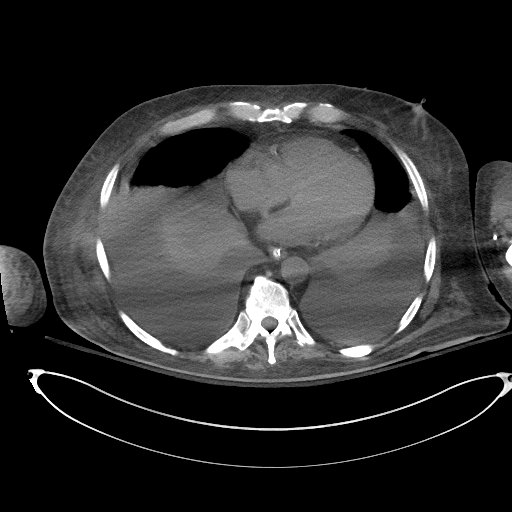

[Series 5: a/p w/o cor · coronal · non-contrast · 0.66mm/px · 3 of 148 slices shown]
[im 50/148  soft-tissue]
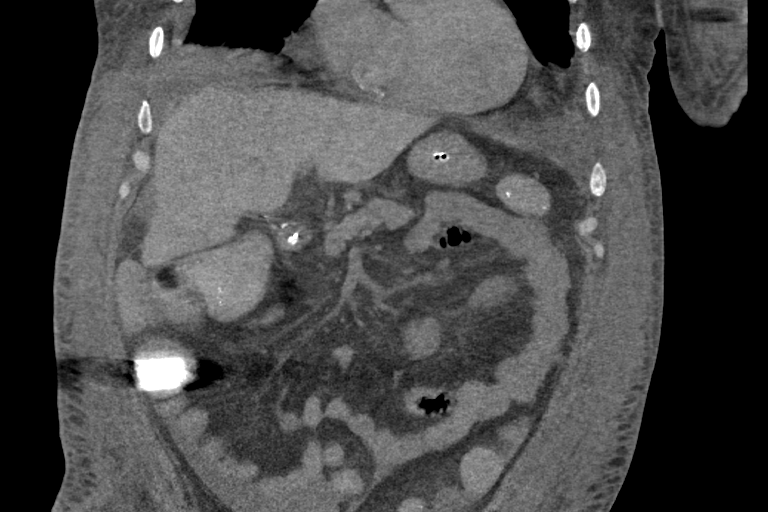
[im 66/148  soft-tissue]
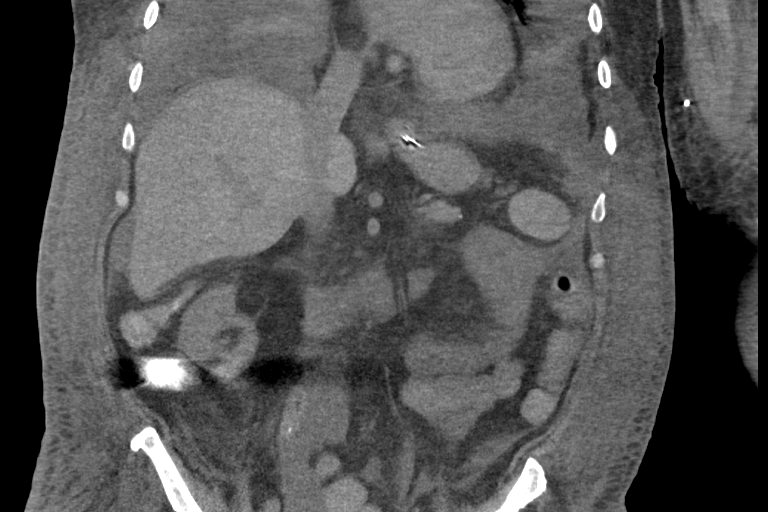
[im 82/148  soft-tissue]
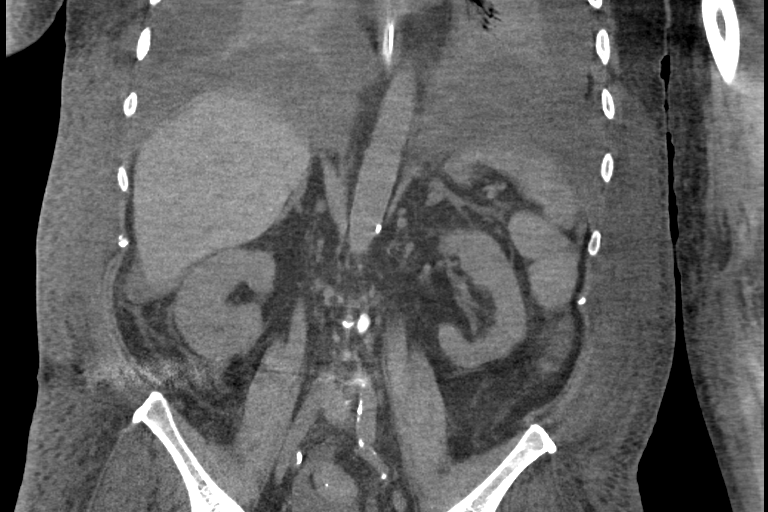

[16 of 46 positions shown; findings below may reference images not displayed]

FINDINGS: Mild degenerative changes are noted in the lower lumbar spine.
Moderate bilateral pleural effusions are noted with adjacent
subsegmental atelectasis.

Distal tip of nasogastric tube appears to be in the gastric antrum
or proximal duodenum. No focal abnormality is noted in the liver,
spleen or pancreas on these unenhanced images. Adrenal glands and
kidneys are unremarkable. No hydronephrosis or renal obstruction is
noted. Moderate anasarca is noted. Atherosclerosis of abdominal
aorta is noted. Mild amount of free fluid is noted in the visualized
portion of the pelvis. No significant adenopathy is noted.
IMPRESSION: Moderate bilateral pleural effusions are noted with adjacent
subsegmental atelectasis.

Moderate anasarca is noted.

Aortic atherosclerosis.

Mild amount of free fluid seen in visualized portion of the pelvis.

Distal tip of nasogastric tube appears to be in the gastric antrum
or proximal duodenum.
